# Patient Record
Sex: Female | Born: 1975
Health system: Southern US, Community
[De-identification: ages and names within clinical notes are randomized; demographics above are authoritative.]

## PROBLEM LIST (undated history)

## (undated) ENCOUNTER — Inpatient Hospital Stay (HOSPITAL_COMMUNITY): Payer: Self-pay

## (undated) DIAGNOSIS — O24419 Gestational diabetes mellitus in pregnancy, unspecified control: Secondary | ICD-10-CM

## (undated) DIAGNOSIS — Z9109 Other allergy status, other than to drugs and biological substances: Secondary | ICD-10-CM

## (undated) DIAGNOSIS — M1990 Unspecified osteoarthritis, unspecified site: Secondary | ICD-10-CM

## (undated) DIAGNOSIS — Z8669 Personal history of other diseases of the nervous system and sense organs: Secondary | ICD-10-CM

## (undated) DIAGNOSIS — T8859XA Other complications of anesthesia, initial encounter: Secondary | ICD-10-CM

## (undated) DIAGNOSIS — Z86018 Personal history of other benign neoplasm: Secondary | ICD-10-CM

## (undated) DIAGNOSIS — F410 Panic disorder [episodic paroxysmal anxiety] without agoraphobia: Secondary | ICD-10-CM

## (undated) DIAGNOSIS — D509 Iron deficiency anemia, unspecified: Secondary | ICD-10-CM

## (undated) DIAGNOSIS — Z87442 Personal history of urinary calculi: Secondary | ICD-10-CM

## (undated) DIAGNOSIS — N200 Calculus of kidney: Secondary | ICD-10-CM

## (undated) HISTORY — DX: Gestational diabetes mellitus in pregnancy, unspecified control: O24.419

## (undated) HISTORY — PX: TONSILLECTOMY: SUR1361

## (undated) HISTORY — PX: BRAIN TUMOR EXCISION: SHX577

---

## 1997-11-28 ENCOUNTER — Inpatient Hospital Stay (HOSPITAL_COMMUNITY): Admission: AD | Admit: 1997-11-28 | Discharge: 1997-11-28 | Payer: Self-pay | Admitting: Obstetrics and Gynecology

## 1997-12-18 ENCOUNTER — Ambulatory Visit (HOSPITAL_COMMUNITY): Admission: RE | Admit: 1997-12-18 | Discharge: 1997-12-18 | Payer: Self-pay | Admitting: Obstetrics and Gynecology

## 1997-12-23 ENCOUNTER — Ambulatory Visit (HOSPITAL_COMMUNITY): Admission: RE | Admit: 1997-12-23 | Discharge: 1997-12-23 | Payer: Self-pay | Admitting: Obstetrics and Gynecology

## 1998-01-10 ENCOUNTER — Inpatient Hospital Stay (HOSPITAL_COMMUNITY): Admission: AD | Admit: 1998-01-10 | Discharge: 1998-01-10 | Payer: Self-pay | Admitting: Obstetrics and Gynecology

## 1998-02-02 ENCOUNTER — Inpatient Hospital Stay (HOSPITAL_COMMUNITY): Admission: AD | Admit: 1998-02-02 | Discharge: 1998-02-02 | Payer: Self-pay | Admitting: Obstetrics and Gynecology

## 1998-02-03 ENCOUNTER — Inpatient Hospital Stay (HOSPITAL_COMMUNITY): Admission: AD | Admit: 1998-02-03 | Discharge: 1998-02-03 | Payer: Self-pay | Admitting: Obstetrics and Gynecology

## 1998-02-20 ENCOUNTER — Inpatient Hospital Stay (HOSPITAL_COMMUNITY): Admission: AD | Admit: 1998-02-20 | Discharge: 1998-02-20 | Payer: Self-pay | Admitting: Obstetrics and Gynecology

## 1998-03-04 ENCOUNTER — Inpatient Hospital Stay (HOSPITAL_COMMUNITY): Admission: AD | Admit: 1998-03-04 | Discharge: 1998-03-06 | Payer: Self-pay | Admitting: Obstetrics and Gynecology

## 1998-07-25 ENCOUNTER — Emergency Department (HOSPITAL_COMMUNITY): Admission: EM | Admit: 1998-07-25 | Discharge: 1998-07-26 | Payer: Self-pay | Admitting: Emergency Medicine

## 1999-03-31 ENCOUNTER — Other Ambulatory Visit: Admission: RE | Admit: 1999-03-31 | Discharge: 1999-03-31 | Payer: Self-pay | Admitting: Obstetrics and Gynecology

## 1999-07-24 ENCOUNTER — Encounter: Payer: Self-pay | Admitting: Obstetrics and Gynecology

## 1999-07-24 ENCOUNTER — Ambulatory Visit (HOSPITAL_COMMUNITY): Admission: RE | Admit: 1999-07-24 | Discharge: 1999-07-24 | Payer: Self-pay | Admitting: Obstetrics and Gynecology

## 2000-01-22 ENCOUNTER — Other Ambulatory Visit: Admission: RE | Admit: 2000-01-22 | Discharge: 2000-01-22 | Payer: Self-pay | Admitting: Obstetrics and Gynecology

## 2000-03-18 ENCOUNTER — Encounter: Payer: Self-pay | Admitting: Obstetrics and Gynecology

## 2000-03-18 ENCOUNTER — Ambulatory Visit (HOSPITAL_COMMUNITY): Admission: RE | Admit: 2000-03-18 | Discharge: 2000-03-18 | Payer: Self-pay | Admitting: Obstetrics and Gynecology

## 2000-06-13 ENCOUNTER — Ambulatory Visit (HOSPITAL_COMMUNITY): Admission: RE | Admit: 2000-06-13 | Discharge: 2000-06-13 | Payer: Self-pay | Admitting: Obstetrics and Gynecology

## 2000-06-16 ENCOUNTER — Inpatient Hospital Stay (HOSPITAL_COMMUNITY): Admission: AD | Admit: 2000-06-16 | Discharge: 2000-06-16 | Payer: Self-pay | Admitting: Obstetrics and Gynecology

## 2000-07-12 ENCOUNTER — Inpatient Hospital Stay (HOSPITAL_COMMUNITY): Admission: AD | Admit: 2000-07-12 | Discharge: 2000-07-12 | Payer: Self-pay | Admitting: Obstetrics and Gynecology

## 2000-08-03 ENCOUNTER — Inpatient Hospital Stay (HOSPITAL_COMMUNITY): Admission: AD | Admit: 2000-08-03 | Discharge: 2000-08-03 | Payer: Self-pay | Admitting: Obstetrics and Gynecology

## 2000-08-09 ENCOUNTER — Inpatient Hospital Stay (HOSPITAL_COMMUNITY): Admission: AD | Admit: 2000-08-09 | Discharge: 2000-08-11 | Payer: Self-pay | Admitting: Obstetrics and Gynecology

## 2000-12-30 ENCOUNTER — Other Ambulatory Visit: Admission: RE | Admit: 2000-12-30 | Discharge: 2000-12-30 | Payer: Self-pay | Admitting: Obstetrics and Gynecology

## 2001-01-20 ENCOUNTER — Encounter: Payer: Self-pay | Admitting: *Deleted

## 2001-01-20 ENCOUNTER — Encounter: Admission: RE | Admit: 2001-01-20 | Discharge: 2001-01-20 | Payer: Self-pay | Admitting: *Deleted

## 2002-02-13 ENCOUNTER — Emergency Department (HOSPITAL_COMMUNITY): Admission: EM | Admit: 2002-02-13 | Discharge: 2002-02-13 | Payer: Self-pay

## 2003-02-04 ENCOUNTER — Other Ambulatory Visit: Admission: RE | Admit: 2003-02-04 | Discharge: 2003-02-04 | Payer: Self-pay | Admitting: Obstetrics and Gynecology

## 2003-07-13 ENCOUNTER — Emergency Department (HOSPITAL_COMMUNITY): Admission: EM | Admit: 2003-07-13 | Discharge: 2003-07-13 | Payer: Self-pay | Admitting: Emergency Medicine

## 2003-08-02 ENCOUNTER — Emergency Department (HOSPITAL_COMMUNITY): Admission: AD | Admit: 2003-08-02 | Discharge: 2003-08-02 | Payer: Self-pay | Admitting: Family Medicine

## 2004-01-09 ENCOUNTER — Other Ambulatory Visit: Admission: RE | Admit: 2004-01-09 | Discharge: 2004-01-09 | Payer: Self-pay | Admitting: Obstetrics and Gynecology

## 2004-07-25 ENCOUNTER — Inpatient Hospital Stay (HOSPITAL_COMMUNITY): Admission: AD | Admit: 2004-07-25 | Discharge: 2004-07-28 | Payer: Self-pay | Admitting: Obstetrics and Gynecology

## 2005-02-25 ENCOUNTER — Other Ambulatory Visit: Admission: RE | Admit: 2005-02-25 | Discharge: 2005-02-25 | Payer: Self-pay | Admitting: Obstetrics and Gynecology

## 2006-07-04 ENCOUNTER — Emergency Department (HOSPITAL_COMMUNITY): Admission: EM | Admit: 2006-07-04 | Discharge: 2006-07-04 | Payer: Self-pay | Admitting: Family Medicine

## 2006-09-19 ENCOUNTER — Emergency Department (HOSPITAL_COMMUNITY): Admission: EM | Admit: 2006-09-19 | Discharge: 2006-09-19 | Payer: Self-pay | Admitting: Family Medicine

## 2008-01-17 ENCOUNTER — Ambulatory Visit (HOSPITAL_COMMUNITY): Admission: RE | Admit: 2008-01-17 | Discharge: 2008-01-17 | Payer: Self-pay | Admitting: Internal Medicine

## 2010-06-14 ENCOUNTER — Encounter: Payer: Self-pay | Admitting: Internal Medicine

## 2010-10-09 NOTE — Discharge Summary (Signed)
[facility]  Patient:    Stephanie [name], Stephanie [name]                       MRN: [id number] Adm. Date:  08/09/00 Disc. Date: 08/11/00 Attending:  Zenaida Niece, M.D.                           Discharge Summary  ADMISSION DIAGNOSIS:          Intrauterine pregnancy at 39 weeks.  DISCHARGE DIAGNOSIS:          Intrauterine pregnancy at 39 weeks.  PROCEDURE:                    Spontaneous vaginal delivery.  COMPLICATIONS:                None.  CONSULTING PHYSICIANS:        None.  HISTORY OF PRESENT ILLNESS:   This is a 35 year old white female, gravida 2, para 1-0-0-1, with an EGA of [redacted] weeks by a 9-week ultrasound with an EDC of March 25, who presents for induction due to a favorable cervix.  She has had good fetal movement and no vaginal bleeding or rupture of membranes.  She had occasional contractions.  Prenatal care complicated by possible exposure to Parvo virus B19 with evidence of prior exposure by blood work.  She was treated with a Z-Pak on February 9 for sinusitis and had a period of frequent loose stools with normal stool studies.  PRENATAL LABORATORY DATA:     Blood type is O positive with a negative antibody screen, RPR nonreactive, rubella immune, hepatitis B surface antigen negative, HIV negative, Gonorrhea and Chlamydia negative, triple screen normal. One-hour Glucola 148, three-hour GTT 91, 156, 140, and 58.  Group B Strep is negative.  PAST OBSTETRIC HISTORY:       In 1999 vaginal delivery at 39 weeks 8 pounds 5 ounces without complications.  PAST MEDICAL HISTORY:         History of anemia and a fractured clavicle at age 69.  PAST SURGICAL HISTORY:        Tonsillectomy.  The remainder of her history is noncontributory.  PHYSICAL EXAMINATION:         VITAL SIGNS:  She is afebrile with stable vital signs.  Fetal heart rate tracing reactive without decellerations.  She had irregular contractions.  ABDOMEN: Gravid and nontender with an  estimated fetal weight of 8 pounds.  PELVIC: Vaginal examination 3, 50, and -1 to -2 with a vertex presentation and an adequate pelvis.  Artificial rupture of membranes revealed clear amniotic fluid.  HOSPITAL COURSE:              The patient was admitted and had the above mentioned artificial rupture of membranes for induction and this did put her into labor.  She received only IV Stadol throughout the day, entered active labor, progressed to complete and pushed well.  On the evening of March 19, she delivered a viable female infant with Apgars of 8 and 8 that weighed 8 pounds 3 ounces over an intact perineum.  There was a loose nuchal cord x 1 which was reduced.  The placenta was delivered spontaneous and was intact. Perineum was intact and estimated blood loss was less than 500 cc.  Postpartum she did very well and remained afebrile with stable vital signs and breastfed her baby without complications.  She did  get some nausea, vomiting, and diarrhea, from the GI virus which her husband shared with her.  This was controlled with Lomotil and Phenergan.  On March 21, which was postpartum day #2 she was felt to be stable enough for discharge home.  CONDITION ON DISCHARGE:       Stable.  DISPOSITION:                  Discharged to home.  DISCHARGE INSTRUCTIONS:       Her diet is a regular diet.  Her activity is pelvic rest.  She is to follow up in four to six weeks.  She can take over-the-counter Immodium p.r.n. diarrhea. DD:  08/11/00 TD:  08/11/00 Job: [id number]

## 2010-10-09 NOTE — Discharge Summary (Signed)
Stephanie [name], Stephanie [name]              ACCOUNT NO.:  [id number]   MEDICAL RECORD NO.:  [id number]          PATIENT TYPE:  INP   LOCATION:  9115                          FACILITY:  [facility]   PHYSICIAN:  Malachi Pro. Ambrose Mantle, M.D. DATE OF BIRTH:  [birth year]   DATE OF ADMISSION:  07/25/2004  DATE OF DISCHARGE:                                 DISCHARGE SUMMARY   HOSPITAL COURSE:  A 35 year old white married female para 2-0-0-2 gravida 3;  EDC July 31, 2004 by ultrasound; admitted with right lower quadrant pain,  few contractions and a favorable cervix; also, decreased fetal movement.  Blood group and type O positive, negative antibody, nonreactive serology,  rubella equivocal, hepatitis B surface antigen negative, HIV negative, GC  and chlamydia negative, triple screen negative, 1-hour Glucola 82, group B  strep negative. Vaginal ultrasound December 26, 2003:  Crown-rump length 2.17  cm; 8 weeks 6 days; [facility] July 31, 2004. The patient was treated with  amoxicillin for sinus problem on February 17, 2004. Follow-up ultrasound  March 10, 2004:  Average gestational age [redacted] weeks 5 days; [facility] July 30, 2004. Cervix was 2-3 cm on July 20, 2004 and July 23, 2004. Nonstress  test reactive on July 23, 2004 for decreased fetal movement. On the night of  admission the patient called and stated she had right lower quadrant pain  and decreased fetal movement. MAU nurse felt the cervix was 3 cm, 50%. The  patient was having some contractions. I felt augmentation was appropriate  and the patient agreed. Past medical history:  Allergy to LATEX. Illnesses:  Anemia and thyroid disease, cystitis, and was born with a club foot.  Operation:  T&A at age 19. Injuries:  Fractured clavicle at age 31. Family  history:  Paternal grandfather with an MI and prostate cancer, father with  high blood pressure, brother with high blood pressure and a hole in the  heart, mother with a history of epilepsy. On admission, the patient's  vital  signs were normal. Physical exam did not reveal the cause of the right lower  quadrant pain. Artificial rupture of the membranes produced clear fluid. The  patient did not want Pitocin. Approximately midnight she accepted Pitocin.  The Pitocin was begun and she made very slow but steady progress to full  dilatation and delivered a living female infant, 9 pounds 5 ounces, over an  intact perineum with Apgars of 6 at one and 9 at five minutes. There was one  loop of nuchal cord and there was mild shoulder dystocia managed by  McRoberts maneuver. Placenta was intact, uterus normal. Venous cord pH 7.35.  Blood loss was 400 mL. Postpartum, the patient did well and was discharged  on postpartum day #2. Initial hemoglobin was 11.8; hematocrit 35.4; white  count 12,900; platelet count 254,000. Follow-up hematocrit 28.5.  Comprehensive metabolic profile was normal. Urinalysis was negative. Rubella  was repeated because our results were equivocal and the rubella was 22.1,  immune anything greater than 10.   DISCHARGE DIAGNOSIS:  Intrauterine pregnancy at 39+ weeks, delivered vertex.   OPERATION:  Spontaneous delivery vertex.  FINAL CONDITION:  Improved.   Instructions include our regular discharge instruction booklet. The patient  is asked to return in 6 weeks for follow-up exam. She does not need the  rubella vaccine and she declines analgesics.    TFH/MEDQ  D:  07/28/2004  T:  07/28/2004  Job:  [id number]

## 2011-03-09 ENCOUNTER — Emergency Department (HOSPITAL_COMMUNITY)
Admission: EM | Admit: 2011-03-09 | Discharge: 2011-03-10 | Disposition: A | Discharge status: Home / Self Care | Payer: Self-pay | Attending: Emergency Medicine | Admitting: Emergency Medicine

## 2011-03-09 DIAGNOSIS — R509 Fever, unspecified: Secondary | ICD-10-CM | POA: Insufficient documentation

## 2011-03-09 DIAGNOSIS — R059 Cough, unspecified: Secondary | ICD-10-CM | POA: Insufficient documentation

## 2011-03-09 DIAGNOSIS — R0602 Shortness of breath: Secondary | ICD-10-CM | POA: Insufficient documentation

## 2011-03-09 DIAGNOSIS — R5381 Other malaise: Secondary | ICD-10-CM | POA: Insufficient documentation

## 2011-03-09 DIAGNOSIS — R319 Hematuria, unspecified: Secondary | ICD-10-CM | POA: Insufficient documentation

## 2011-03-09 DIAGNOSIS — R05 Cough: Secondary | ICD-10-CM | POA: Insufficient documentation

## 2011-03-09 DIAGNOSIS — R5383 Other fatigue: Secondary | ICD-10-CM | POA: Insufficient documentation

## 2011-03-09 DIAGNOSIS — R110 Nausea: Secondary | ICD-10-CM | POA: Insufficient documentation

## 2011-03-09 DIAGNOSIS — N200 Calculus of kidney: Secondary | ICD-10-CM | POA: Insufficient documentation

## 2011-03-09 DIAGNOSIS — R197 Diarrhea, unspecified: Secondary | ICD-10-CM | POA: Insufficient documentation

## 2011-03-09 DIAGNOSIS — R109 Unspecified abdominal pain: Secondary | ICD-10-CM | POA: Insufficient documentation

## 2011-03-09 DIAGNOSIS — IMO0001 Reserved for inherently not codable concepts without codable children: Secondary | ICD-10-CM | POA: Insufficient documentation

## 2011-03-09 LAB — POCT PREGNANCY, URINE: Preg Test, Ur: NEGATIVE

## 2011-03-09 LAB — URINALYSIS, ROUTINE W REFLEX MICROSCOPIC
Bilirubin Urine: NEGATIVE
Glucose, UA: NEGATIVE mg/dL
Ketones, ur: 15 mg/dL — AB
Leukocytes, UA: NEGATIVE
Nitrite: NEGATIVE
Protein, ur: NEGATIVE mg/dL
Specific Gravity, Urine: 1.013 (ref 1.005–1.030)
Urobilinogen, UA: 0.2 mg/dL (ref 0.0–1.0)
pH: 6.5 (ref 5.0–8.0)

## 2011-03-10 ENCOUNTER — Encounter (HOSPITAL_COMMUNITY): Payer: Self-pay | Admitting: Radiology

## 2011-03-10 ENCOUNTER — Emergency Department (HOSPITAL_COMMUNITY): Payer: Self-pay

## 2011-03-10 LAB — CBC
Hemoglobin: 11.4 g/dL — ABNORMAL LOW (ref 12.0–15.0)
MCH: 29.8 pg (ref 26.0–34.0)
MCV: 86.4 fL (ref 78.0–100.0)
RBC: 3.83 MIL/uL — ABNORMAL LOW (ref 3.87–5.11)

## 2011-03-10 LAB — COMPREHENSIVE METABOLIC PANEL
ALT: 11 U/L (ref 0–35)
CO2: 21 mEq/L (ref 19–32)
Calcium: 10.1 mg/dL (ref 8.4–10.5)
Creatinine, Ser: 0.7 mg/dL (ref 0.50–1.10)
GFR calc Af Amer: >90 mL/min (ref >90)
GFR calc non Af Amer: >90 mL/min (ref >90)
Glucose, Bld: 114 mg/dL — ABNORMAL HIGH (ref 70–99)
Sodium: 135 mEq/L (ref 135–145)

## 2011-03-10 LAB — DIFFERENTIAL
Lymphocytes Relative: 10 % — ABNORMAL LOW (ref 12–46)
Lymphs Abs: 1.4 10*3/uL (ref 0.7–4.0)
Monocytes Relative: 6 % (ref 3–12)
Neutro Abs: 12.5 10*3/uL — ABNORMAL HIGH (ref 1.7–7.7)
Neutrophils Relative %: 85 % — ABNORMAL HIGH (ref 43–77)

## 2011-03-11 ENCOUNTER — Inpatient Hospital Stay (INDEPENDENT_AMBULATORY_CARE_PROVIDER_SITE_OTHER)
Admission: RE | Admit: 2011-03-11 | Discharge: 2011-03-11 | Disposition: A | Discharge status: Home / Self Care | Payer: Self-pay | Source: Ambulatory Visit | Attending: Family Medicine | Admitting: Family Medicine

## 2011-03-11 DIAGNOSIS — R319 Hematuria, unspecified: Secondary | ICD-10-CM

## 2011-03-11 LAB — POCT URINALYSIS DIP (DEVICE)
Specific Gravity, Urine: 1.025 (ref 1.005–1.030)
pH: 6 (ref 5.0–8.0)

## 2011-03-11 LAB — URINE CULTURE: Culture  Setup Time: 201210170013

## 2011-03-13 ENCOUNTER — Inpatient Hospital Stay (INDEPENDENT_AMBULATORY_CARE_PROVIDER_SITE_OTHER)
Admission: RE | Admit: 2011-03-13 | Discharge: 2011-03-13 | Disposition: A | Discharge status: Home / Self Care | Payer: Self-pay | Source: Ambulatory Visit | Attending: Emergency Medicine | Admitting: Emergency Medicine

## 2011-03-13 DIAGNOSIS — J020 Streptococcal pharyngitis: Secondary | ICD-10-CM

## 2011-03-13 DIAGNOSIS — N200 Calculus of kidney: Secondary | ICD-10-CM

## 2011-03-13 LAB — POCT URINALYSIS DIP (DEVICE)
Bilirubin Urine: NEGATIVE
Ketones, ur: NEGATIVE mg/dL
Specific Gravity, Urine: 1.015 (ref 1.005–1.030)
pH: 8 (ref 5.0–8.0)

## 2011-03-15 LAB — URINE CULTURE
Colony Count: NO GROWTH
Culture  Setup Time: 201210210338

## 2011-03-16 LAB — CULTURE, BLOOD (ROUTINE X 2)
Culture  Setup Time: 201210170334
Culture: NO GROWTH

## 2012-11-10 ENCOUNTER — Encounter (HOSPITAL_COMMUNITY): Payer: Self-pay | Admitting: *Deleted

## 2012-11-10 ENCOUNTER — Emergency Department (HOSPITAL_COMMUNITY): Payer: Self-pay

## 2012-11-10 ENCOUNTER — Emergency Department (HOSPITAL_COMMUNITY)
Admission: EM | Admit: 2012-11-10 | Discharge: 2012-11-11 | Disposition: A | Discharge status: Home / Self Care | Payer: Self-pay | Attending: Emergency Medicine | Admitting: Emergency Medicine

## 2012-11-10 DIAGNOSIS — H0589 Other disorders of orbit: Secondary | ICD-10-CM

## 2012-11-10 DIAGNOSIS — H0520 Unspecified exophthalmos: Secondary | ICD-10-CM | POA: Insufficient documentation

## 2012-11-10 DIAGNOSIS — Z79899 Other long term (current) drug therapy: Secondary | ICD-10-CM | POA: Insufficient documentation

## 2012-11-10 DIAGNOSIS — Z91040 Latex allergy status: Secondary | ICD-10-CM | POA: Insufficient documentation

## 2012-11-10 DIAGNOSIS — J309 Allergic rhinitis, unspecified: Secondary | ICD-10-CM | POA: Insufficient documentation

## 2012-11-10 DIAGNOSIS — H5789 Other specified disorders of eye and adnexa: Secondary | ICD-10-CM | POA: Insufficient documentation

## 2012-11-10 DIAGNOSIS — R51 Headache: Secondary | ICD-10-CM | POA: Insufficient documentation

## 2012-11-10 DIAGNOSIS — Z87442 Personal history of urinary calculi: Secondary | ICD-10-CM | POA: Insufficient documentation

## 2012-11-10 HISTORY — DX: Other allergy status, other than to drugs and biological substances: Z91.09

## 2012-11-10 HISTORY — DX: Calculus of kidney: N20.0

## 2012-11-10 MED ORDER — IOHEXOL 300 MG/ML  SOLN
100.0000 mL | Freq: Once | INTRAMUSCULAR | Status: AC | PRN
  Administered 2012-11-10: 100 mL via INTRAVENOUS

## 2012-11-10 NOTE — ED Provider Notes (Addendum)
History    37 year old female presenting for evaluation of proptosis the right eye. First noticed this about 3 weeks ago. Has progressively been getting worse since then. Denies pain but has a sensation of pressure behind her R eye, in her nose and R upper teeth. Denies any change in visual acuity. No diplopia. No visual cuts. Fever or chills. Denies trauma. No eye drainage. Hx of seasonal allergies and kidney stones, otherwise healthy. No hx of similar symptoms. Evaluated by optometrist today and told that had "increased pressure on her optic nerve."  CSN: [id number]  Arrival date & time 11/10/12  2025   First MD Initiated Contact with Patient 11/10/12 2134      Chief Complaint  Patient presents with  . Eye Problem    (Consider location/radiation/quality/duration/timing/severity/associated sxs/prior treatment) HPI  Past Medical History  Diagnosis Date  . Environmental allergies   . Kidney stones     Past Surgical History  Procedure Laterality Date  . Tonsillectomy      No family history on file.  History  Substance Use Topics  . Smoking status: Never Smoker   . Smokeless tobacco: Not on file  . Alcohol Use: Yes     Comment: social    OB History   Grav Para Term Preterm Abortions TAB SAB Ect Mult Living                  Review of Systems  All systems reviewed and negative, other than as noted in HPI.   Allergies  Flonase and Latex  Home Medications   Current Outpatient Rx  Name  Route  Sig  Dispense  Refill  . cetirizine (ZYRTEC ALLERGY) 10 MG tablet   Oral   Take 10 mg by mouth daily.         Marland Kitchen loteprednol (LOTEMAX) 0.5 % ophthalmic suspension   Right Eye   Place 1 drop into the right eye 2 (two) times daily.           BP 138/82  Pulse 99  Temp(Src) 99.3 F (37.4 C) (Oral)  Resp 20  Wt 170 lb (77.111 kg)  SpO2 99%  LMP 11/08/2012  Physical Exam  Nursing note and vitals reviewed. Constitutional: She appears well-developed and  well-nourished. No distress.  HENT:  Head: Normocephalic and atraumatic.  Eyes: Conjunctivae and EOM are normal. Pupils are equal, round, and reactive to light. Right eye exhibits no discharge. Left eye exhibits no discharge.  R eye proptosis. Periorbital tissues unremarkable. EOMI intact. Denies pain with EOM movement. Tactile tonometry symmetric b/l. Anterior chamber clear. Conjunctiva normal in appearance.  Neck: Neck supple.  Cardiovascular: Normal rate, regular rhythm and normal heart sounds.  Exam reveals no gallop and no friction rub.   No murmur heard. Pulmonary/Chest: Effort normal and breath sounds normal. No respiratory distress.  Abdominal: Soft. She exhibits no distension. There is no tenderness.  Musculoskeletal: She exhibits no edema and no tenderness.  Neurological: She is alert. No cranial nerve deficit.  Skin: Skin is warm and dry.  Psychiatric: She has a normal mood and affect. Her behavior is normal. Thought content normal.    ED Course  Procedures (including critical care time)  Labs Reviewed - No data to display Ct Head W Wo Contrast  11/10/2012   *RADIOLOGY REPORT*  Clinical Data: Right eye swelling and loss of vision.  CT HEAD WITHOUT AND WITH CONTRAST  Technique:  Contiguous axial images were obtained from the base of the skull through the vertex  without and with intravenous contrast.  Contrast: OMNIPAQUE IOHEXOL 300 MG/ML  SOLN  Comparison: None  Findings: The right globe is proptotic and there is fairly marked hyperostosis involving the posterior lateral wall of the right orbit and also the sphenoid bone.  There is enhancing soft tissue around the lesion with mass effect on the lateral rectus muscle and slight bowing of the optic nerve.  There is also enhancing soft tissue in the anterior cranial fossa and some enhancing soft tissue along the right  lateral external orbital region.  This could reflect a meningioma.  Lymphoma is another possibility.  The  ventricles are normal.  No extra-axial fluid collections.  No CT findings for infarction or hemorrhage.  The brainstem and cerebellum grossly normal.  IMPRESSION: Probable primary osseous lesion with soft tissue components enhancing in the intraorbital region, anterior cranial fossa and along the external aspect of the right orbit.  Meningioma and lymphoma are possibilities.  There is mild mass effect on the lateral rectus muscle and optic nerve.   Original Report Authenticated By: Rudie Meyer, M.D.      1. Orbital mass   2. Ocular proptosis       MDM  37yF with R eye proptosis. CT as above. Discussed with Dr Yetta Barre, neurosurgery. Reviewed films. Feels complexity requiring tertiary care center.  Will discuss with neurosurgery/ophtho at [facility] to help facilitate prompt follow-up.  Discussed with Dr Sheppard Penton at [facility]. Pt can be seen on Monday in [place] or Thursday at [place]. Does not recommend continued steroids. Discussed plan with pt and fiance. Contact information with scheduling coordinator, April, provided. Provided with a copy of her imaging. Emergent return precautions discussed.        Raeford Razor, MD 11/11/12 [phone]

## 2012-11-10 NOTE — ED Notes (Signed)
Patient transported to CT 

## 2012-11-10 NOTE — ED Notes (Signed)
Right eye swelling and loss of side vision,  She has had an ocular scan and thyroid blood work today and she has been using steroid drops and the swelling has decreased minimal,  Pt states she can feel pressure behind her right eye, pt didn't have a CT scan earlier today but doctor did suggest it and now she wishes she had scan. So pt has field matrix test and ocular scan .  Dr Ashok Cordia is available if we need to converse with her,  Pt has her cell number if needed

## 2012-11-11 MED ORDER — DEXAMETHASONE SODIUM PHOSPHATE 10 MG/ML IJ SOLN
10.0000 mg | Freq: Once | INTRAMUSCULAR | Status: AC
  Administered 2012-11-11: 10 mg via INTRAVENOUS
  Filled 2012-11-11: qty 1

## 2012-11-11 NOTE — ED Notes (Signed)
Pt w/ rt eye protrusion laterally and swelling x1 week, pt denies pain, only admits to pressure. Pt has been seen for this however did not have CT scan at that time as ophthalmologist suggest. Pt here tonight requesting CT scan.

## 2012-11-11 NOTE — ED Notes (Signed)
Pt ambulating independently w/ steady gait on d/c in no acute distress, A&Ox4.D/c instructions reviewed w/ pt and family - pt and family deny any further questions or concerns at present.  

## 2012-11-13 ENCOUNTER — Ambulatory Visit (HOSPITAL_COMMUNITY)
Admission: RE | Admit: 2012-11-13 | Discharge: 2012-11-13 | Disposition: A | Discharge status: Home / Self Care | Payer: Medicaid Other | Source: Ambulatory Visit | Attending: Emergency Medicine | Admitting: Emergency Medicine

## 2012-11-13 DIAGNOSIS — M852 Hyperostosis of skull: Secondary | ICD-10-CM | POA: Insufficient documentation

## 2012-11-13 DIAGNOSIS — H5789 Other specified disorders of eye and adnexa: Secondary | ICD-10-CM | POA: Insufficient documentation

## 2012-11-13 DIAGNOSIS — H0520 Unspecified exophthalmos: Secondary | ICD-10-CM

## 2012-11-13 MED ORDER — GADOBENATE DIMEGLUMINE 529 MG/ML IV SOLN
20.0000 mL | Freq: Once | INTRAVENOUS | Status: AC | PRN
  Administered 2012-11-13: 17 mL via INTRAVENOUS

## 2012-11-21 HISTORY — PX: SPHENOIDECTOMY: SHX2421

## 2013-01-09 ENCOUNTER — Emergency Department (HOSPITAL_COMMUNITY)
Admission: EM | Admit: 2013-01-09 | Discharge: 2013-01-09 | Disposition: A | Discharge status: Home / Self Care | Payer: 59 | Source: Home / Self Care

## 2013-01-09 ENCOUNTER — Encounter (HOSPITAL_COMMUNITY): Payer: Self-pay | Admitting: Emergency Medicine

## 2013-01-09 DIAGNOSIS — R5381 Other malaise: Secondary | ICD-10-CM

## 2013-01-09 DIAGNOSIS — N390 Urinary tract infection, site not specified: Secondary | ICD-10-CM

## 2013-01-09 LAB — POCT URINALYSIS DIP (DEVICE)
Bilirubin Urine: NEGATIVE
Nitrite: NEGATIVE
Protein, ur: NEGATIVE mg/dL
pH: 7.5 (ref 5.0–8.0)

## 2013-01-09 MED ORDER — ONDANSETRON HCL 4 MG PO TABS: 4.0000 mg | ORAL_TABLET | Freq: Four times a day (QID) | ORAL | Status: DC

## 2013-01-09 MED ORDER — CEPHALEXIN 500 MG PO CAPS: 500.0000 mg | ORAL_CAPSULE | Freq: Four times a day (QID) | ORAL | Status: DC

## 2013-01-09 NOTE — ED Notes (Signed)
Pt c/o poss UTI onset Sunday Sxs include: urinary frequency, fevers, BA, HA, weakness Denies: dysuria, hematuria, abd pain Recently had craniectomy to remove sphenoid wing meningioma w/bone over growth in July at [facility] Has appt w/nuerosurgeon on Monday... Alert w/no signs of acute distress

## 2013-01-09 NOTE — ED Provider Notes (Signed)
CSN: [id number]     Arrival date & time 01/09/13  1421 History     First MD Initiated Contact with Patient 01/09/13 1452     Chief Complaint  Patient presents with  . Urinary Tract Infection   (Consider location/radiation/quality/duration/timing/severity/associated sxs/prior Treatment) HPI Comments: 37 year old female is nearly 4 weeks status post craniectomy for meningioma resection. Although she has complained of persistent weakness since the surgery  sometimes associated with chills, decreased fluid intake and nausea she has recently developed a urinary urgency with the sensation of bladder fullness, small voids followed by the sensation to void again. Denies pain associated with wheezing. She is complaining of malaise, fatigue, off and on chills for the past 3-4 days. Some nausea no vomiting. No current fever.   Past Medical History  Diagnosis Date  . Environmental allergies   . Kidney stones    Past Surgical History  Procedure Laterality Date  . Tonsillectomy    . Sphenoidectomy  11/2012   No family history on file. History  Substance Use Topics  . Smoking status: Never Smoker   . Smokeless tobacco: Not on file  . Alcohol Use: Yes     Comment: social   OB History   Grav Para Term Preterm Abortions TAB SAB Ect Mult Living                 Review of Systems  Constitutional: Positive for fever, activity change, appetite change and fatigue.  Respiratory: Negative.   Cardiovascular: Negative.   Gastrointestinal: Positive for nausea. Negative for vomiting and abdominal pain.  Genitourinary: Positive for urgency, frequency and decreased urine volume. Negative for dysuria, flank pain, vaginal bleeding, vaginal discharge, vaginal pain, menstrual problem and pelvic pain.  Skin: Negative for rash.  Neurological: Positive for headaches. Negative for seizures, speech difficulty and numbness.    Allergies  Flonase and Latex  Home Medications   Current Outpatient Rx  Name   Route  Sig  Dispense  Refill  . cetirizine (ZYRTEC ALLERGY) 10 MG tablet   Oral   Take 10 mg by mouth daily.         Marland Kitchen desogestrel-ethinyl estradiol (APRI,EMOQUETTE,SOLIA) 0.15-30 MG-MCG tablet   Oral   Take 1 tablet by mouth daily.         . Oxycodone-Acetaminophen (PERCOCET PO)   Oral   Take by mouth.         . cephALEXin (KEFLEX) 500 MG capsule   Oral   Take 1 capsule (500 mg total) by mouth 4 (four) times daily.   40 capsule   0   . loteprednol (LOTEMAX) 0.5 % ophthalmic suspension   Right Eye   Place 1 drop into the right eye 2 (two) times daily.          BP 121/77  Pulse 96  Temp(Src) 98.7 F (37.1 C) (Oral)  Resp 16  SpO2 98%  LMP 12/27/2012 Physical Exam  Nursing note and vitals reviewed. Constitutional: She is oriented to person, place, and time. She appears well-developed and well-nourished. No distress.  Appears mildly ill but not toxic.  Eyes: EOM are normal.  Neck: Normal range of motion.  Cardiovascular: Normal rate, regular rhythm and normal heart sounds.   Pulmonary/Chest: Effort normal and breath sounds normal. No respiratory distress.  Abdominal: Soft. Bowel sounds are normal. She exhibits no distension and no mass. There is no tenderness. There is no rebound and no guarding.  Musculoskeletal: She exhibits no edema.  Neurological: She is alert and oriented  to person, place, and time. She exhibits normal muscle tone.  Skin: Skin is warm and dry.    ED Course   Procedures (including critical care time)  Labs Reviewed  POCT URINALYSIS DIP (DEVICE) - Abnormal; Notable for the following:    Hgb urine dipstick SMALL (*)    Leukocytes, UA TRACE (*)    All other components within normal limits  URINE CULTURE  POCT PREGNANCY, URINE   No results found. 1. UTI (lower urinary tract infection)   2. Malaise and fatigue     MDM  Is somewhat difficult to discern if the symptoms described today are all due to the UTI diagnosis. Some symptoms had  persisted since her cranial surgery on July 21. She is advised to take Keflex as directed and if not better in 48 hours or if worse or develops new symptoms she should go to emergency department. She has no local followup. There are no new head related symptoms although she continues to have periodic headaches. She is afebrile today and in stable condition. Keflex 500 4 times a day, increase her fluid intake.   Hayden Rasmussen, NP 01/09/13 1601

## 2013-01-09 NOTE — ED Provider Notes (Signed)
Medical screening examination/treatment/procedure(s) were performed by non-physician practitioner and as supervising physician I was immediately available for consultation/collaboration.  Leslee Home, M.D.  Reuben Likes, MD 01/09/13 [phone]

## 2013-01-10 LAB — URINE CULTURE: Colony Count: NO GROWTH

## 2013-06-23 ENCOUNTER — Encounter (HOSPITAL_COMMUNITY): Payer: Self-pay | Admitting: Emergency Medicine

## 2013-06-23 ENCOUNTER — Emergency Department (INDEPENDENT_AMBULATORY_CARE_PROVIDER_SITE_OTHER)
Admission: EM | Admit: 2013-06-23 | Discharge: 2013-06-23 | Disposition: A | Discharge status: Home / Self Care | Payer: 59 | Source: Home / Self Care | Attending: Emergency Medicine | Admitting: Emergency Medicine

## 2013-06-23 DIAGNOSIS — N10 Acute tubulo-interstitial nephritis: Secondary | ICD-10-CM

## 2013-06-23 LAB — POCT URINALYSIS DIP (DEVICE)
Bilirubin Urine: NEGATIVE
Glucose, UA: NEGATIVE mg/dL
Ketones, ur: NEGATIVE mg/dL
NITRITE: NEGATIVE
PH: 8.5 — AB (ref 5.0–8.0)
PROTEIN: NEGATIVE mg/dL
Specific Gravity, Urine: 1.02 (ref 1.005–1.030)
UROBILINOGEN UA: 0.2 mg/dL (ref 0.0–1.0)

## 2013-06-23 LAB — POCT PREGNANCY, URINE: Preg Test, Ur: NEGATIVE

## 2013-06-23 MED ORDER — HYDROCODONE-ACETAMINOPHEN 5-325 MG PO TABS
ORAL_TABLET | ORAL | Status: DC
Start: 2013-06-23 — End: 2013-12-17

## 2013-06-23 MED ORDER — LIDOCAINE HCL (PF) 1 % IJ SOLN
INTRAMUSCULAR | Status: AC
  Filled 2013-06-23: qty 5

## 2013-06-23 MED ORDER — CIPROFLOXACIN HCL 500 MG PO TABS: 500.0000 mg | ORAL_TABLET | Freq: Two times a day (BID) | ORAL | Status: DC

## 2013-06-23 MED ORDER — CEFTRIAXONE SODIUM 1 G IJ SOLR
1.0000 g | Freq: Once | INTRAMUSCULAR | Status: AC
  Administered 2013-06-23: 1 g via INTRAMUSCULAR

## 2013-06-23 MED ORDER — CEFTRIAXONE SODIUM 1 G IJ SOLR
INTRAMUSCULAR | Status: AC
  Filled 2013-06-23: qty 10

## 2013-06-23 NOTE — Discharge Instructions (Signed)

## 2013-06-23 NOTE — ED Provider Notes (Signed)
Chief Complaint   Chief Complaint  Patient presents with  . Urinary Tract Infection    History of Present Illness   Stephanie [name] is a 38 year old female who's had a one-week history of dysuria, frequency, and urgency. She denies any hematuria or malodorous urine. She's had a temperature 101.2 degrees, chills, abdominal pain, and back pain. She had a urinary tract infection last August or September. She denies any nausea, vomiting, or GYN complaints.  Review of Systems   Other than as noted above, the patient denies any of the following symptoms: General:  No fevers, chills, or sweats. GI:  No abdominal pain, back pain, nausea, vomiting, diarrhea, or constipation. GU:  No dysuria, frequency, urgency, hematuria, or incontinence. GYN:  No discharge, itching, vulvar pain or lesions, pelvic pain, or abnormal vaginal bleeding.  PMFSH   Past medical history, family history, social history, meds, and allergies were reviewed.  She has a history of benign brain tumor.  Physical Examination     Vital signs:  BP 132/79  Pulse 88  Temp(Src) 98 F (36.7 C) (Oral)  SpO2 100%  LMP 06/08/2013 Gen:  Alert, oriented, in no distress. Lungs:  Clear to auscultation, no wheezes, rales or rhonchi. Heart:  Regular rhythm, no gallop or murmer. Abdomen:  Flat and soft. There was slight suprapubic pain to palpation.  No guarding, or rebound.  No hepato-splenomegaly or mass.  Bowel sounds were normally active.  No hernia. Back:  No CVA tenderness.  Skin:  Clear, warm and dry.  Labs   Results for orders placed during the hospital encounter of 06/23/13  POCT URINALYSIS DIP (DEVICE)      Result Value Range   Glucose, UA NEGATIVE  NEGATIVE mg/dL   Bilirubin Urine NEGATIVE  NEGATIVE   Ketones, ur NEGATIVE  NEGATIVE mg/dL   Specific Gravity, Urine 1.020  1.005 - 1.030   Hgb urine dipstick TRACE (*) NEGATIVE   pH 8.5 (*) 5.0 - 8.0   Protein, ur NEGATIVE  NEGATIVE mg/dL   Urobilinogen, UA 0.2  0.0  - 1.0 mg/dL   Nitrite NEGATIVE  NEGATIVE   Leukocytes, UA TRACE (*) NEGATIVE  POCT PREGNANCY, URINE      Result Value Range   Preg Test, Ur NEGATIVE  NEGATIVE     A urine culture was obtained.  Results are pending at this time and we will call about any positive results.  Course in Urgent Care Center   Given Rocephin 1 g IM.  Assessment   The encounter diagnosis was Acute pyelonephritis.   Appears to have mild pyelonephritis. No vomiting right now, so I think she be a good candidate for outpatient treatment.  Plan   1.  Meds:  The following meds were prescribed:   Discharge Medication List as of 06/23/2013  9:32 AM    START taking these medications   Details  ciprofloxacin (CIPRO) 500 MG tablet Take 1 tablet (500 mg total) by mouth every 12 (twelve) hours., Starting 06/23/2013, Until Discontinued, Normal    HYDROcodone-acetaminophen (NORCO/VICODIN) 5-325 MG per tablet 1 to 2 tabs every 4 to 6 hours as needed for pain., Print        2.  Patient Education/Counseling:  The patient was given appropriate handouts, self care instructions, and instructed in symptomatic relief. The patient was told to avoid intercourse for 10 days, get extra fluids, and return for a follow up with her primary care doctor at the completion of treatment for a repeat UA and culture.  Return here in 48 hours if no better, or immediately if she develops vomiting or severe pain.  3.  Follow up:  The patient was told to follow up here if no better in 3 to 4 days, or sooner if becoming worse in any way, and given some red flag symptoms such as fever, persistent vomiting, or severe flank or abdominal pain which would prompt immediate return.     Reuben Likesavid C Ayano Douthitt, MD 06/23/13 [id number]

## 2013-06-23 NOTE — ED Notes (Signed)
C/o UTI for a week now States she has back pain, burning when urinating, urinating frequently and fever/chills States ibuprofen was used as treatment States she did try to flush kidneys out with cranberry and water

## 2013-06-25 LAB — URINE CULTURE: SPECIAL REQUESTS: NORMAL

## 2013-06-25 NOTE — Progress Notes (Signed)
Quick Note:  Results are abnormal as noted, but have been adequately treated. The patient called back today and stated that she did not feel much better. She is on ciprofloxacin, her enterococcus is sensitive to levofloxacin, but ciprofloxacin was not tested. Since she's not doing any better we told her to stop the ciprofloxacin and called in a prescription for amoxicillin 500 mg #30 one 3 times a day. Suggested she return here if no better in 48 hours. No further action necessary. ______

## 2013-06-25 NOTE — ED Notes (Signed)
Patient called, c/o she is no better from her visit 1-31. After review of chart and C&S report, Dr Lorenz CoasterKeller authorized amoxicillin 500 mg, x 30 , take TID, NR use generic and pyridium 200 mg po TID prn x 15 doses. Called in to [employer], [place] at pt request, left detailed message on answering machine

## 2013-06-26 NOTE — ED Notes (Signed)
Urine culture: 85,000 colonies Enterococcus species. Pt. treated with Cipro (not on sensitivity).  Pt. called yesterday and was no better.  Dr. Lorenz CoasterKeller changed her to Amoxicillin.  Rx. called by Michiel CowboyNancy Stack RN. Vassie MoselleYork, Brettany Sydney M 06/26/2013

## 2013-12-17 ENCOUNTER — Emergency Department (HOSPITAL_COMMUNITY): Payer: 59

## 2013-12-17 ENCOUNTER — Emergency Department (HOSPITAL_COMMUNITY)
Admission: EM | Admit: 2013-12-17 | Discharge: 2013-12-17 | Disposition: A | Discharge status: Home / Self Care | Payer: 59 | Attending: Emergency Medicine | Admitting: Emergency Medicine

## 2013-12-17 ENCOUNTER — Encounter (HOSPITAL_COMMUNITY): Payer: Self-pay | Admitting: Emergency Medicine

## 2013-12-17 DIAGNOSIS — Z91040 Latex allergy status: Secondary | ICD-10-CM | POA: Diagnosis not present

## 2013-12-17 DIAGNOSIS — Z87442 Personal history of urinary calculi: Secondary | ICD-10-CM | POA: Diagnosis not present

## 2013-12-17 DIAGNOSIS — M542 Cervicalgia: Secondary | ICD-10-CM | POA: Insufficient documentation

## 2013-12-17 DIAGNOSIS — H538 Other visual disturbances: Secondary | ICD-10-CM | POA: Diagnosis not present

## 2013-12-17 DIAGNOSIS — H53149 Visual discomfort, unspecified: Secondary | ICD-10-CM | POA: Insufficient documentation

## 2013-12-17 DIAGNOSIS — Z3202 Encounter for pregnancy test, result negative: Secondary | ICD-10-CM | POA: Insufficient documentation

## 2013-12-17 DIAGNOSIS — R51 Headache: Secondary | ICD-10-CM | POA: Insufficient documentation

## 2013-12-17 DIAGNOSIS — R5383 Other fatigue: Secondary | ICD-10-CM

## 2013-12-17 DIAGNOSIS — R5381 Other malaise: Secondary | ICD-10-CM | POA: Insufficient documentation

## 2013-12-17 DIAGNOSIS — Z791 Long term (current) use of non-steroidal anti-inflammatories (NSAID): Secondary | ICD-10-CM | POA: Insufficient documentation

## 2013-12-17 DIAGNOSIS — Z8709 Personal history of other diseases of the respiratory system: Secondary | ICD-10-CM | POA: Insufficient documentation

## 2013-12-17 DIAGNOSIS — R519 Headache, unspecified: Secondary | ICD-10-CM

## 2013-12-17 LAB — POC URINE PREG, ED: PREG TEST UR: NEGATIVE

## 2013-12-17 MED ORDER — KETOROLAC TROMETHAMINE 30 MG/ML IJ SOLN
30.0000 mg | Freq: Once | INTRAMUSCULAR | Status: AC
  Administered 2013-12-17: 30 mg via INTRAVENOUS
  Filled 2013-12-17: qty 1

## 2013-12-17 MED ORDER — SODIUM CHLORIDE 0.9 % IV BOLUS (SEPSIS)
1000.0000 mL | Freq: Once | INTRAVENOUS | Status: AC
Start: 2013-12-17 — End: 2013-12-17
  Administered 2013-12-17: 1000 mL via INTRAVENOUS

## 2013-12-17 MED ORDER — METOCLOPRAMIDE HCL 5 MG/ML IJ SOLN
10.0000 mg | Freq: Once | INTRAMUSCULAR | Status: AC
  Administered 2013-12-17: 10 mg via INTRAVENOUS
  Filled 2013-12-17: qty 2

## 2013-12-17 MED ORDER — DIPHENHYDRAMINE HCL 50 MG/ML IJ SOLN
25.0000 mg | Freq: Once | INTRAMUSCULAR | Status: AC
  Administered 2013-12-17: 25 mg via INTRAVENOUS
  Filled 2013-12-17: qty 1

## 2013-12-17 MED ORDER — DEXAMETHASONE SODIUM PHOSPHATE 10 MG/ML IJ SOLN
10.0000 mg | Freq: Once | INTRAMUSCULAR | Status: AC
  Administered 2013-12-17: 10 mg via INTRAVENOUS
  Filled 2013-12-17: qty 1

## 2013-12-17 NOTE — Discharge Instructions (Signed)

## 2013-12-17 NOTE — ED Notes (Signed)
Patient transported to CT 

## 2013-12-17 NOTE — ED Provider Notes (Signed)
CSN: [id number]     Arrival date & time 12/17/13  1050 History   First MD Initiated Contact with Patient 12/17/13 1147     Chief Complaint  Patient presents with  . Headache  . Neck Pain  . Nausea  . Dizziness     (Consider location/radiation/quality/duration/timing/severity/associated sxs/prior Treatment) Patient is a 38 y.o. female presenting with headaches.  Headache Pain location:  Frontal Quality: pressure. Radiates to:  Does not radiate Pain severity now: severe. Onset quality:  Gradual Duration:  7 days Timing:  Constant Progression:  Waxing and waning Chronicity:  Recurrent Similar to prior headaches: yes (but longer)   Context comment:  Hx of sphenoid meningioma with intermittent headaches, blurry vision since resection and facial reconstruction. Relieved by:  Nothing Worsened by:  Light Ineffective treatments: tramadol. Associated symptoms: blurred vision (Has blurry vision at baseline, but worse with headache), fatigue, neck pain and photophobia   Associated symptoms: no fever   Associated symptoms comment:  Focal left sided neck pain which started suddenly when she turned her head two days ago.   Past Medical History  Diagnosis Date  . Environmental allergies   . Kidney stones    Past Surgical History  Procedure Laterality Date  . Tonsillectomy    . Sphenoidectomy  11/2012  . Brain tumor excision     No family history on file. History  Substance Use Topics  . Smoking status: Never Smoker   . Smokeless tobacco: Not on file  . Alcohol Use: Yes     Comment: social   OB History   Grav Para Term Preterm Abortions TAB SAB Ect Mult Living                 Review of Systems  Constitutional: Positive for fatigue. Negative for fever.  Eyes: Positive for blurred vision (Has blurry vision at baseline, but worse with headache) and photophobia.  Musculoskeletal: Positive for neck pain.  Neurological: Positive for headaches.  All other systems reviewed and  are negative.     Allergies  Flonase and Latex  Home Medications   Prior to Admission medications   Medication Sig Start Date End Date Taking? Authorizing Provider  ibuprofen (ADVIL,MOTRIN) 600 MG tablet Take 600 mg by mouth every 6 (six) hours as needed for headache or moderate pain.   Yes Historical Provider, MD  levonorgestrel (MIRENA) 20 MCG/24HR IUD 1 each by Intrauterine route once.   Yes Historical Provider, MD  meloxicam (MOBIC) 15 MG tablet Take 15 mg by mouth daily.   Yes Historical Provider, MD  traMADol (ULTRAM) 50 MG tablet Take 50 mg by mouth every 6 (six) hours as needed for moderate pain.   Yes Historical Provider, MD   BP 121/79  Pulse 83  Temp(Src) 98.2 F (36.8 C) (Oral)  Resp 12  SpO2 96%  LMP 11/27/2013 Physical Exam  Nursing note and vitals reviewed. Constitutional: She is oriented to person, place, and time. She appears well-developed and well-nourished. No distress.  HENT:  Head: Normocephalic and atraumatic.  Mouth/Throat: Oropharynx is clear and moist.  Eyes: Conjunctivae are normal. Pupils are equal, round, and reactive to light. No scleral icterus.  Neck: Neck supple.  Cardiovascular: Normal rate, regular rhythm, normal heart sounds and intact distal pulses.   No murmur heard. Pulmonary/Chest: Effort normal and breath sounds normal. No stridor. No respiratory distress. She has no rales.  Abdominal: Soft. Bowel sounds are normal. She exhibits no distension. There is no tenderness.  Musculoskeletal: Normal range of motion.  Neurological: She is alert and oriented to person, place, and time. She has normal strength. A cranial nerve deficit (right face sensory deficit, otherwise normal) is present. No sensory deficit. GCS eye subscore is 4. GCS verbal subscore is 5. GCS motor subscore is 6.  Skin: Skin is warm and dry. No rash noted.  Psychiatric: She has a normal mood and affect. Her behavior is normal.    ED Course  Procedures (including critical  care time) Labs Review Labs Reviewed  POC URINE PREG, ED    Imaging Review Ct Head Wo Contrast  12/17/2013   CLINICAL DATA:  Headache, nausea, dizziness the patient is status post right craniotomy tumor removal  EXAM: CT HEAD WITHOUT CONTRAST  TECHNIQUE: Contiguous axial images were obtained from the base of the skull through the vertex without intravenous contrast.  COMPARISON:  None.  FINDINGS: The patient is status post right craniotomy and right sphenoidectomy. No intracranial hemorrhage, mass effect or midline shift. No hydrocephalus. No mass lesion is noted on this unenhanced scan. No acute cortical infarction.  Paranasal sinuses and mastoid air cells are unremarkable.  IMPRESSION: Status post right craniotomy and right sphenoidectomy. No intracranial hemorrhage, mass effect or midline shift. No acute cortical infarction.   Electronically Signed   By: Natasha MeadLiviu  Pop M.D.   On: 12/17/2013 13:22  All radiology studies independently viewed by me.      EKG Interpretation None      MDM   Final diagnoses:  Intractable headache, unspecified chronicity pattern, unspecified headache type    38 yo female with history of headaches associated with her sphenoid meningioma and its treatment.  This headache similar to prior in all ways except it has lasted longer and she has left sided neck pain.  Presentation not consistent with meningitis or SAH.  Plan IV metoclopramide, diphenhydramine, dexamethasone, and fluids and head CT.  Head CT negative.  Added toradol.  Pain much improved.  Plan dc home, pcp follow up.  Return precautions given.    Candyce ChurnJohn David Marquerite Forsman III, MD 12/17/13 [phone]

## 2013-12-17 NOTE — ED Notes (Signed)
Upon further assesment, pt had brain tumor removed in July 2014, had radiation in Nov 2014, and last scan in April 2015 showed no new growth.

## 2013-12-17 NOTE — ED Notes (Addendum)
Pt states that she started getting a headache last Tuesday and it has been constant ever since with periods of increased severity with off and on dizziness. Pt also states that she has been dealing with double-vision since her brain tumor removal last year, but got worse when the headache began. She states she began to feel nauseated today. Pt also states that her hands are swollen.

## 2013-12-17 NOTE — Progress Notes (Signed)
  CARE MANAGEMENT ED NOTE 12/17/2013  Patient:  Stephanie [name],Stephanie [name]   Account Number:  [id number]  Date Initiated:  12/17/2013  Documentation initiated by:  Edd ArbourGIBBS,Jaunice Mirza  Subjective/Objective Assessment:   38 yr old [employer] c/o a headache last Tuesday and it has been constant ever since with periods of increased severity with off and on dizziness, double-vision since her brain tumor removal last yr, nauseated, hands are swelling     Subjective/Objective Assessment Detail:   pcp is Anna GenreStephanie [name] per pt     Action/Plan:   ED CM spoke with pt and female at bedside updated EPIC   Action/Plan Detail:   Anticipated DC Date:  12/17/2013     Status Recommendation to Physician:   Result of Recommendation:    Other ED Services  Consult Working Plan    DC Planning Services  Other  PCP issues  Outpatient Services - Pt will follow up    Choice offered to / List presented to:            Status of service:  Completed, signed off  ED Comments:   ED Comments Detail:

## 2014-04-10 ENCOUNTER — Emergency Department (HOSPITAL_COMMUNITY)
Admission: EM | Admit: 2014-04-10 | Discharge: 2014-04-10 | Disposition: A | Discharge status: Home / Self Care | Payer: 59 | Attending: Emergency Medicine | Admitting: Emergency Medicine

## 2014-04-10 ENCOUNTER — Encounter (HOSPITAL_COMMUNITY): Payer: Self-pay | Admitting: Emergency Medicine

## 2014-04-10 ENCOUNTER — Emergency Department (HOSPITAL_COMMUNITY): Payer: 59

## 2014-04-10 DIAGNOSIS — Z791 Long term (current) use of non-steroidal anti-inflammatories (NSAID): Secondary | ICD-10-CM | POA: Diagnosis not present

## 2014-04-10 DIAGNOSIS — Y998 Other external cause status: Secondary | ICD-10-CM | POA: Insufficient documentation

## 2014-04-10 DIAGNOSIS — S199XXA Unspecified injury of neck, initial encounter: Secondary | ICD-10-CM | POA: Diagnosis present

## 2014-04-10 DIAGNOSIS — S161XXA Strain of muscle, fascia and tendon at neck level, initial encounter: Secondary | ICD-10-CM | POA: Insufficient documentation

## 2014-04-10 DIAGNOSIS — S3992XA Unspecified injury of lower back, initial encounter: Secondary | ICD-10-CM | POA: Diagnosis not present

## 2014-04-10 DIAGNOSIS — Z87442 Personal history of urinary calculi: Secondary | ICD-10-CM | POA: Diagnosis not present

## 2014-04-10 DIAGNOSIS — Y9389 Activity, other specified: Secondary | ICD-10-CM | POA: Insufficient documentation

## 2014-04-10 DIAGNOSIS — Y92410 Unspecified street and highway as the place of occurrence of the external cause: Secondary | ICD-10-CM | POA: Diagnosis not present

## 2014-04-10 DIAGNOSIS — Z91040 Latex allergy status: Secondary | ICD-10-CM | POA: Diagnosis not present

## 2014-04-10 MED ORDER — HYDROCODONE-ACETAMINOPHEN 5-325 MG PO TABS
2.0000 | ORAL_TABLET | Freq: Once | ORAL | Status: AC
  Administered 2014-04-10: 1 via ORAL
  Filled 2014-04-10: qty 2

## 2014-04-10 MED ORDER — HYDROCODONE-ACETAMINOPHEN 5-325 MG PO TABS: ORAL_TABLET | ORAL | Status: DC
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
## 2014-04-10 NOTE — Discharge Instructions (Signed)
Please follow with your primary care doctor in the next 2 days for a check-up. They must obtain records for further management.   Do not hesitate to return to the Emergency Department for any new, worsening or concerning symptoms.   Take vicodin for breakthrough pain, do not drink alcohol, drive, care for children or do other critical tasks while taking vicodin.   Cervical Sprain A cervical sprain is an injury in the neck in which the strong, fibrous tissues (ligaments) that connect your neck bones stretch or tear. Cervical sprains can range from mild to severe. Severe cervical sprains can cause the neck vertebrae to be unstable. This can lead to damage of the spinal cord and can result in serious nervous system problems. The amount of time it takes for a cervical sprain to get better depends on the cause and extent of the injury. Most cervical sprains heal in 1 to 3 weeks. CAUSES  Severe cervical sprains may be caused by:   Contact sport injuries (such as from football, rugby, wrestling, hockey, auto racing, gymnastics, diving, martial arts, or boxing).   Motor vehicle collisions.   Whiplash injuries. This is an injury from a sudden forward and backward whipping movement of the head and neck.  Falls.  Mild cervical sprains may be caused by:   Being in an awkward position, such as while cradling a telephone between your ear and shoulder.   Sitting in a chair that does not offer proper support.   Working at a poorly Marketing executivedesigned computer station.   Looking up or down for long periods of time.  SYMPTOMS   Pain, soreness, stiffness, or a burning sensation in the front, back, or sides of the neck. This discomfort may develop immediately after the injury or slowly, 24 hours or more after the injury.   Pain or tenderness directly in the middle of the back of the neck.   Shoulder or upper back pain.   Limited ability to move the neck.   Headache.   Dizziness.   Weakness,  numbness, or tingling in the hands or arms.   Muscle spasms.   Difficulty swallowing or chewing.   Tenderness and swelling of the neck.  DIAGNOSIS  Most of the time your health care provider can diagnose a cervical sprain by taking your history and doing a physical exam. Your health care provider will ask about previous neck injuries and any known neck problems, such as arthritis in the neck. X-rays may be taken to find out if there are any other problems, such as with the bones of the neck. Other tests, such as a CT scan or MRI, may also be needed.  TREATMENT  Treatment depends on the severity of the cervical sprain. Mild sprains can be treated with rest, keeping the neck in place (immobilization), and pain medicines. Severe cervical sprains are immediately immobilized. Further treatment is done to help with pain, muscle spasms, and other symptoms and may include:  Medicines, such as pain relievers, numbing medicines, or muscle relaxants.   Physical therapy. This may involve stretching exercises, strengthening exercises, and posture training. Exercises and improved posture can help stabilize the neck, strengthen muscles, and help stop symptoms from returning.  HOME CARE INSTRUCTIONS   Put ice on the injured area.   Put ice in a plastic bag.   Place a towel between your skin and the bag.   Leave the ice on for 15-20 minutes, 3-4 times a day.   If your injury was severe, you  may have been given a cervical collar to wear. A cervical collar is a two-piece collar designed to keep your neck from moving while it heals.  Do not remove the collar unless instructed by your health care provider.  If you have long hair, keep it outside of the collar.  Ask your health care provider before making any adjustments to your collar. Minor adjustments may be required over time to improve comfort and reduce pressure on your chin or on the back of your head.  Ifyou are allowed to remove the  collar for cleaning or bathing, follow your health care provider's instructions on how to do so safely.  Keep your collar clean by wiping it with mild soap and water and drying it completely. If the collar you have been given includes removable pads, remove them every 1-2 days and hand wash them with soap and water. Allow them to air dry. They should be completely dry before you wear them in the collar.  If you are allowed to remove the collar for cleaning and bathing, wash and dry the skin of your neck. Check your skin for irritation or sores. If you see any, tell your health care provider.  Do not drive while wearing the collar.   Only take over-the-counter or prescription medicines for pain, discomfort, or fever as directed by your health care provider.   Keep all follow-up appointments as directed by your health care provider.   Keep all physical therapy appointments as directed by your health care provider.   Make any needed adjustments to your workstation to promote good posture.   Avoid positions and activities that make your symptoms worse.   Warm up and stretch before being active to help prevent problems.  SEEK MEDICAL CARE IF:   Your pain is not controlled with medicine.   You are unable to decrease your pain medicine over time as planned.   Your activity level is not improving as expected.  SEEK IMMEDIATE MEDICAL CARE IF:   You develop any bleeding.  You develop stomach upset.  You have signs of an allergic reaction to your medicine.   Your symptoms get worse.   You develop new, unexplained symptoms.   You have numbness, tingling, weakness, or paralysis in any part of your body.  MAKE SURE YOU:   Understand these instructions.  Will watch your condition.  Will get help right away if you are not doing well or get worse. Document Released: 03/07/2007 Document Revised: 05/15/2013 Document Reviewed: 11/15/2012 [facility] Patient Information 2015  [place] [employer], [place]. This information is not intended to replace advice given to you by your health care provider. Make sure you discuss any questions you have with your health care provider.
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
## 2014-04-10 NOTE — ED Notes (Signed)
Pt refused second vicodin.

## 2014-06-19 ENCOUNTER — Encounter (HOSPITAL_COMMUNITY): Payer: Self-pay | Admitting: Emergency Medicine

## 2014-06-19 ENCOUNTER — Emergency Department (HOSPITAL_COMMUNITY)
Admission: EM | Admit: 2014-06-19 | Discharge: 2014-06-20 | Discharge status: Against Medical Advice | Payer: 59 | Attending: Emergency Medicine | Admitting: Emergency Medicine

## 2014-06-19 ENCOUNTER — Emergency Department (HOSPITAL_COMMUNITY): Payer: 59

## 2014-06-19 ENCOUNTER — Encounter (HOSPITAL_COMMUNITY): Payer: Self-pay | Admitting: Adult Health

## 2014-06-19 ENCOUNTER — Emergency Department (INDEPENDENT_AMBULATORY_CARE_PROVIDER_SITE_OTHER)
Admission: EM | Admit: 2014-06-19 | Discharge: 2014-06-19 | Disposition: A | Discharge status: Nursing Home (Includes ICF) | Payer: 59 | Source: Home / Self Care | Attending: Family Medicine | Admitting: Family Medicine

## 2014-06-19 DIAGNOSIS — Z87442 Personal history of urinary calculi: Secondary | ICD-10-CM | POA: Insufficient documentation

## 2014-06-19 DIAGNOSIS — Z791 Long term (current) use of non-steroidal anti-inflammatories (NSAID): Secondary | ICD-10-CM | POA: Diagnosis not present

## 2014-06-19 DIAGNOSIS — R109 Unspecified abdominal pain: Secondary | ICD-10-CM

## 2014-06-19 DIAGNOSIS — R1030 Lower abdominal pain, unspecified: Secondary | ICD-10-CM

## 2014-06-19 DIAGNOSIS — Z91040 Latex allergy status: Secondary | ICD-10-CM | POA: Insufficient documentation

## 2014-06-19 DIAGNOSIS — K5900 Constipation, unspecified: Secondary | ICD-10-CM | POA: Insufficient documentation

## 2014-06-19 LAB — CBC WITH DIFFERENTIAL/PLATELET
BASOS ABS: 0 10*3/uL (ref 0.0–0.1)
Basophils Relative: 0 % (ref 0–1)
EOS PCT: 2 % (ref 0–5)
Eosinophils Absolute: 0.3 10*3/uL (ref 0.0–0.7)
HEMATOCRIT: 38.4 % (ref 36.0–46.0)
HEMOGLOBIN: 12.7 g/dL (ref 12.0–15.0)
LYMPHS PCT: 31 % (ref 12–46)
Lymphs Abs: 3.5 10*3/uL (ref 0.7–4.0)
MCH: 28 pg (ref 26.0–34.0)
MCHC: 33.1 g/dL (ref 30.0–36.0)
MCV: 84.8 fL (ref 78.0–100.0)
MONOS PCT: 5 % (ref 3–12)
Monocytes Absolute: 0.6 10*3/uL (ref 0.1–1.0)
Neutro Abs: 6.8 10*3/uL (ref 1.7–7.7)
Neutrophils Relative %: 62 % (ref 43–77)
Platelets: 249 10*3/uL (ref 150–400)
RBC: 4.53 MIL/uL (ref 3.87–5.11)
RDW: 14.5 % (ref 11.5–15.5)
WBC: 11.3 10*3/uL — ABNORMAL HIGH (ref 4.0–10.5)

## 2014-06-19 LAB — POCT URINALYSIS DIP (DEVICE)
BILIRUBIN URINE: NEGATIVE
Glucose, UA: NEGATIVE mg/dL
KETONES UR: NEGATIVE mg/dL
LEUKOCYTES UA: NEGATIVE
NITRITE: NEGATIVE
PROTEIN: NEGATIVE mg/dL
SPECIFIC GRAVITY, URINE: 1.025 (ref 1.005–1.030)
Urobilinogen, UA: 1 mg/dL (ref 0.0–1.0)
pH: 7 (ref 5.0–8.0)

## 2014-06-19 LAB — COMPREHENSIVE METABOLIC PANEL
ALT: 18 U/L (ref 0–35)
ANION GAP: 6 (ref 5–15)
AST: 19 U/L (ref 0–37)
Albumin: 4.2 g/dL (ref 3.5–5.2)
Alkaline Phosphatase: 71 U/L (ref 39–117)
BILIRUBIN TOTAL: 0.6 mg/dL (ref 0.3–1.2)
CO2: 26 mmol/L (ref 19–32)
CREATININE: 0.74 mg/dL (ref 0.50–1.10)
Calcium: 9.5 mg/dL (ref 8.4–10.5)
Chloride: 108 mmol/L (ref 96–112)
GFR calc Af Amer: >90 mL/min (ref >90)
GFR calc non Af Amer: >90 mL/min (ref >90)
Glucose, Bld: 90 mg/dL (ref 70–99)
Potassium: 4.2 mmol/L (ref 3.5–5.1)
Sodium: 140 mmol/L (ref 135–145)
Total Protein: 7 g/dL (ref 6.0–8.3)

## 2014-06-19 LAB — POCT PREGNANCY, URINE: PREG TEST UR: NEGATIVE

## 2014-06-19 MED ORDER — ONDANSETRON HCL 4 MG/2ML IJ SOLN
4.0000 mg | Freq: Once | INTRAMUSCULAR | Status: AC
  Administered 2014-06-19: 4 mg via INTRAVENOUS
  Filled 2014-06-19: qty 2

## 2014-06-19 MED ORDER — IOHEXOL 300 MG/ML  SOLN
25.0000 mL | Freq: Once | INTRAMUSCULAR | Status: AC | PRN
  Administered 2014-06-19: 25 mL via ORAL

## 2014-06-19 NOTE — ED Notes (Signed)
Presents with lower back pain, lower abdominal pain, nausea and emesis x3 in 2 days. LAst BM was today but she reports chronic constipation and when she has a BM it is very small. "It has never been like this before and I have tried laxatives and suppositories. I am in so much pain and I feel like if I could have a good BM it would be better. I went to pharmacy to buy an enema and they told me I needed to be seen. I am just so uncomfortable" [facility].

## 2014-06-19 NOTE — ED Provider Notes (Addendum)
CSN: [id number]     Arrival date & time 06/19/14  1856 History   First MD Initiated Contact with Patient 06/19/14 2129     Chief Complaint  Patient presents with  . Constipation  . Abdominal Pain     (Consider location/radiation/quality/duration/timing/severity/associated sxs/prior Treatment) HPI Comments: Pt presents with 1-2 weeks of a constant burning low abdominal pain with intermittent cramping pains with radiation to bilateral sides back with associated nausea and vomiting.  She is also reported intermittent diarrhea and constipation.  There has been more prominently constipation and she does not feel that she has had a normal sized bowel movement for 3 weeks.  She reports subjective fevers and low-grade temperature at home, denies urinary symptoms, vaginal bleeding or discharge.  She's a history of brain tumor excision and radiation as well as kidney stones.  She has had intermittent diarrhea and constipation since that procedure, but states she's never had constipation this bad.   Past Medical History  Diagnosis Date  . Environmental allergies   . Kidney stones    Past Surgical History  Procedure Laterality Date  . Tonsillectomy    . Sphenoidectomy  11/2012  . Brain tumor excision     History reviewed. No pertinent family history. History  Substance Use Topics  . Smoking status: Never Smoker   . Smokeless tobacco: Not on file  . Alcohol Use: Yes     Comment: social   OB History    No data available     Review of Systems  Constitutional: Negative for fever, chills, diaphoresis, activity change, appetite change and fatigue.  HENT: Negative for congestion, facial swelling, rhinorrhea and sore throat.   Eyes: Negative for photophobia and discharge.  Respiratory: Negative for cough, chest tightness and shortness of breath.   Cardiovascular: Negative for chest pain, palpitations and leg swelling.  Gastrointestinal: Positive for abdominal pain, diarrhea, constipation and  abdominal distention. Negative for nausea and vomiting.  Endocrine: Negative for polydipsia and polyuria.  Genitourinary: Negative for dysuria, frequency, difficulty urinating and pelvic pain.  Musculoskeletal: Negative for back pain, arthralgias, neck pain and neck stiffness.  Skin: Negative for color change and wound.  Allergic/Immunologic: Negative for immunocompromised state.  Neurological: Negative for facial asymmetry, weakness, numbness and headaches.  Hematological: Does not bruise/bleed easily.  Psychiatric/Behavioral: Negative for confusion and agitation.      Allergies  Flonase and Latex  Home Medications   Prior to Admission medications   Medication Sig Start Date End Date Taking? Authorizing Provider  HYDROcodone-acetaminophen (NORCO/VICODIN) 5-325 MG per tablet Take 1-2 tablets by mouth every 6 hours as needed for pain. 04/10/14   Nicole Pisciotta, PA-C  ibuprofen (ADVIL,MOTRIN) 600 MG tablet Take 600 mg by mouth every 6 (six) hours as needed for headache or moderate pain.    Historical Provider, MD  levonorgestrel (MIRENA) 20 MCG/24HR IUD 1 each by Intrauterine route once.    Historical Provider, MD  meloxicam (MOBIC) 15 MG tablet Take 15 mg by mouth daily.    Historical Provider, MD  polyethylene glycol (MIRALAX / GLYCOLAX) packet Take 17 g by mouth daily. Take 3 doses in a large Gatorade and drink over the course of 1 day. Decrease to once daily for one week when stool is soft. 06/20/14   Toy Cookey, MD  traMADol (ULTRAM) 50 MG tablet Take 50 mg by mouth every 6 (six) hours as needed for moderate pain.    Historical Provider, MD   BP 114/77 mmHg  Pulse 92  Temp(Src) 98.7  F (37.1 C)  Resp 14  SpO2 99%  LMP 05/25/2014 Physical Exam  Constitutional: She is oriented to person, place, and time. She appears well-developed and well-nourished. No distress.  HENT:  Head: Normocephalic and atraumatic.  Mouth/Throat: No oropharyngeal exudate.  Eyes: Pupils are equal,  round, and reactive to light.  Neck: Normal range of motion. Neck supple.  Cardiovascular: Normal rate, regular rhythm and normal heart sounds.  Exam reveals no gallop and no friction rub.   No murmur heard. Pulmonary/Chest: Effort normal and breath sounds normal. No respiratory distress. She has no wheezes. She has no rales.  Abdominal: Soft. Bowel sounds are normal. She exhibits no distension and no mass. There is tenderness in the right lower quadrant, suprapubic area and left lower quadrant. There is no rebound and no guarding.  Musculoskeletal: Normal range of motion. She exhibits no edema or tenderness.  Neurological: She is alert and oriented to person, place, and time.  Skin: Skin is warm and dry.  Psychiatric: She has a normal mood and affect.    ED Course  Procedures (including critical care time) Labs Review Labs Reviewed  CBC WITH DIFFERENTIAL/PLATELET - Abnormal; Notable for the following:    WBC 11.3 (*)    All other components within normal limits  COMPREHENSIVE METABOLIC PANEL - Abnormal; Notable for the following:    BUN <5 (*)    All other components within normal limits    Imaging Review Dg Abd 1 View  06/19/2014   CLINICAL DATA:  Initial encounter for nausea with constipation and lower abdominal pain for 1 week.  EXAM: ABDOMEN - 1 VIEW  COMPARISON:  None.  FINDINGS: Supine abdomen shows no gaseous bowel dilatation to suggest obstruction. No substantial stool volume to suggest clinical constipation. Mild convex leftward of lumbar scoliosis is evident. IUD projects over the central anatomic pelvis.  IMPRESSION: Negative.   Electronically Signed   By: Kennith Center M.D.   On: 06/19/2014 20:23     EKG Interpretation None      MDM   Final diagnoses:  Lower abdominal pain  Constipation, unspecified constipation type    Pt is a 39 y.o. female with Pmhx as above who presents with 1-2 weeks of a constant burning low abdominal pain with intermittent cramping pains  with radiation to bilateral sides back with associated nausea and vomiting.  She is also reported intermittent diarrhea and constipation.  There has been more prominently constipation and she does not feel that she has had a normal sized bowel movement for 3 weeks.  She reports subjective fevers and low-grade temperature at home, denies urinary symptoms, vaginal bleeding or discharge.  She's a history of brain tumor excision and radiation as well as kidney stones.  She has had intermittent diarrhea and constipation since that procedure, but states she's never had constipation this bad.  On physical exam vitals are stable and she is in no acute distress.  She is positive bilateral lower quadrant tenderness palpation without rebound or guarding as well as left CVA tenderness.  Acute abdominal series does not show significant stool consistent with patient's history.  She also has a mild white blood cell count elevation and given low-grade temperature at urgent care today, I am not confident that symptoms can be said to be consistent with just constipation.  CT abdomen and pelvis ordered. (Is a point-of-care.  UA with trace blood and point-of-care Pred negative at urgent care today)  Dr. Preston Fleeting will follow CT read. If normal, plan  to d/c home with miralax.   Patient has eloped without having CT scan or obtaining her paperwork.   Matthias Hughsebecca G Matassa evaluation in the Emergency Department is complete. It has been determined that no acute conditions requiring further emergency intervention are present at this time. The patient/guardian have been advised of the diagnosis and plan. We have discussed signs and symptoms that warrant return to the ED, such as changes or worsening in symptoms, worsening pain, fever, inability to tolerate liquids.       Toy CookeyMegan Docherty, MD 06/20/14 [id number]  Toy CookeyMegan Docherty, MD 06/20/14 1252

## 2014-06-19 NOTE — ED Notes (Signed)
[facility] shuttle service[facility] Art[facility] FlockKindl.

## 2014-06-19 NOTE — ED Provider Notes (Signed)
CSN: [id number]     Arrival date & time 06/19/14  1640 History   First MD Initiated Contact with Patient 06/19/14 1822     Chief Complaint  Patient presents with  . Abdominal Pain  . Back Pain   (Consider location/radiation/quality/duration/timing/severity/associated sxs/prior Treatment) Patient is a 39 y.o. female presenting with abdominal pain. The history is provided by the patient.  Abdominal Pain Pain location:  Generalized Pain quality: bloating, cramping and shooting   Pain radiates to:  Back Pain severity:  Moderate Onset quality:  Gradual Duration:  3 weeks Timing:  Constant Progression:  Worsening Chronicity:  Recurrent Context: laxative use   Context comment:  Last nl bm 3 weeks ago , no blood seen. Relieved by:  Nothing Ineffective treatments:  Belching and OTC medications (laxative use) Associated symptoms: anorexia, belching, constipation, diarrhea, fever, nausea and vomiting     Past Medical History  Diagnosis Date  . Environmental allergies   . Kidney stones    Past Surgical History  Procedure Laterality Date  . Tonsillectomy    . Sphenoidectomy  11/2012  . Brain tumor excision     No family history on file. History  Substance Use Topics  . Smoking status: Never Smoker   . Smokeless tobacco: Not on file  . Alcohol Use: Yes     Comment: social   OB History    No data available     Review of Systems  Constitutional: Positive for fever and appetite change.  Gastrointestinal: Positive for nausea, vomiting, abdominal pain, diarrhea, constipation and anorexia.    Allergies  Flonase and Latex  Home Medications   Prior to Admission medications   Medication Sig Start Date End Date Taking? Authorizing Provider  traMADol (ULTRAM) 50 MG tablet Take 50 mg by mouth every 6 (six) hours as needed for moderate pain.   Yes Historical Provider, MD  HYDROcodone-acetaminophen (NORCO/VICODIN) 5-325 MG per tablet Take 1-2 tablets by mouth every 6 hours as  needed for pain. 04/10/14   Nicole Pisciotta, PA-C  ibuprofen (ADVIL,MOTRIN) 600 MG tablet Take 600 mg by mouth every 6 (six) hours as needed for headache or moderate pain.    Historical Provider, MD  levonorgestrel (MIRENA) 20 MCG/24HR IUD 1 each by Intrauterine route once.    Historical Provider, MD  meloxicam (MOBIC) 15 MG tablet Take 15 mg by mouth daily.    Historical Provider, MD   BP 123/79 mmHg  Pulse 99  Temp(Src) 99.3 F (37.4 C) (Oral)  Resp 18  SpO2 96%  LMP 05/25/2014 Physical Exam  Constitutional: She is oriented to person, place, and time. She appears well-developed and well-nourished. She appears distressed.  Neck: Normal range of motion. Neck supple.  Abdominal: Soft. Bowel sounds are normal. She exhibits no distension and no mass. There is no hepatosplenomegaly. There is generalized tenderness. There is no rebound, no guarding and no CVA tenderness. No hernia.  Neurological: She is alert and oriented to person, place, and time.  Skin: Skin is warm and dry.  Nursing note and vitals reviewed.   ED Course  Procedures (including critical care time) Labs Review Labs Reviewed  POCT URINALYSIS DIP (DEVICE) - Abnormal; Notable for the following:    Hgb urine dipstick TRACE (*)    All other components within normal limits  POCT PREGNANCY, URINE    Imaging Review No results found.   MDM   1. Abdominal pain in female patient    Sent for abd pain w/u for >1wk, assoc fever, n/v/d.  Linna HoffJames D Oather [place], MD 06/19/14 [id number]

## 2014-06-19 NOTE — ED Notes (Signed)
C/o abd and back pain onset 1 week Sx also reports constipation, nauseas, fevers, chills, decreased appetite Also states she has not been voiding urine "as much" Alert, no signs of acute distress.

## 2014-06-20 MED ORDER — POLYETHYLENE GLYCOL 3350 17 G PO PACK: 17.0000 g | PACK | Freq: Every day | ORAL | Status: DC

## 2014-06-20 NOTE — Discharge Instructions (Signed)
Abdominal Pain, Women °Abdominal (stomach, pelvic, or belly) pain can be caused by many things. It is important to tell your doctor: °· The location of the pain. °· Does it come and go or is it present all the time? °· Are there things that start the pain (eating certain foods, exercise)? °· Are there other symptoms associated with the pain (fever, nausea, vomiting, diarrhea)? °All of this is helpful to know when trying to find the cause of the pain. °CAUSES  °· Stomach: virus or bacteria infection, or ulcer. °· Intestine: appendicitis (inflamed appendix), regional ileitis (Crohn's disease), ulcerative colitis (inflamed colon), irritable bowel syndrome, diverticulitis (inflamed diverticulum of the colon), or cancer of the stomach or intestine. °· Gallbladder disease or stones in the gallbladder. °· Kidney disease, kidney stones, or infection. °· Pancreas infection or cancer. °· Fibromyalgia (pain disorder). °· Diseases of the female organs: °¨ Uterus: fibroid (non-cancerous) tumors or infection. °¨ Fallopian tubes: infection or tubal pregnancy. °¨ Ovary: cysts or tumors. °¨ Pelvic adhesions (scar tissue). °¨ Endometriosis (uterus lining tissue growing in the pelvis and on the pelvic organs). °¨ Pelvic congestion syndrome (female organs filling up with blood just before the menstrual period). °¨ Pain with the menstrual period. °¨ Pain with ovulation (producing an egg). °¨ Pain with an IUD (intrauterine device, birth control) in the uterus. °¨ Cancer of the female organs. °· Functional pain (pain not caused by a disease, may improve without treatment). °· Psychological pain. °· Depression. °DIAGNOSIS  °Your doctor will decide the seriousness of your pain by doing an examination. °· Blood tests. °· X-rays. °· Ultrasound. °· CT scan (computed tomography, special type of X-ray). °· MRI (magnetic resonance imaging). °· Cultures, for infection. °· Barium enema (dye inserted in the large intestine, to better view it with  X-rays). °· Colonoscopy (looking in intestine with a lighted tube). °· Laparoscopy (minor surgery, looking in abdomen with a lighted tube). °· Major abdominal exploratory surgery (looking in abdomen with a large incision). °TREATMENT  °The treatment will depend on the cause of the pain.  °· Many cases can be observed and treated at home. °· Over-the-counter medicines recommended by your caregiver. °· Prescription medicine. °· Antibiotics, for infection. °· Birth control pills, for painful periods or for ovulation pain. °· Hormone treatment, for endometriosis. °· Nerve blocking injections. °· Physical therapy. °· Antidepressants. °· Counseling with a psychologist or psychiatrist. °· Minor or major surgery. °HOME CARE INSTRUCTIONS  °· Do not take laxatives, unless directed by your caregiver. °· Take over-the-counter pain medicine only if ordered by your caregiver. Do not take aspirin because it can cause an upset stomach or bleeding. °· Try a clear liquid diet (broth or water) as ordered by your caregiver. Slowly move to a bland diet, as tolerated, if the pain is related to the stomach or intestine. °· Have a thermometer and take your temperature several times a day, and record it. °· Bed rest and sleep, if it helps the pain. °· Avoid sexual intercourse, if it causes pain. °· Avoid stressful situations. °· Keep your follow-up appointments and tests, as your caregiver orders. °· If the pain does not go away with medicine or surgery, you may try: °¨ Acupuncture. °¨ Relaxation exercises (yoga, meditation). °¨ Group therapy. °¨ Counseling. °SEEK MEDICAL CARE IF:  °· You notice certain foods cause stomach pain. °· Your home care treatment is not helping your pain. °· You need stronger pain medicine. °· You want your IUD removed. °· You feel faint or   lightheaded. °· You develop nausea and vomiting. °· You develop a rash. °· You are having side effects or an allergy to your medicine. °SEEK IMMEDIATE MEDICAL CARE IF:  °· Your  pain does not go away or gets worse. °· You have a fever. °· Your pain is felt only in portions of the abdomen. The right side could possibly be appendicitis. The left lower portion of the abdomen could be colitis or diverticulitis. °· You are passing blood in your stools (bright red or black tarry stools, with or without vomiting). °· You have blood in your urine. °· You develop chills, with or without a fever. °· You pass out. °MAKE SURE YOU:  °· Understand these instructions. °· Will watch your condition. °· Will get help right away if you are not doing well or get worse. °Document Released: 03/07/2007 Document Revised: 09/24/2013 Document Reviewed: 03/27/2009 °Exit[employer]® Patient Information ©2015 [employer]. This information is not intended to replace advice given to you by your health care provider. Make sure you discuss any questions you have with your health care provider. ° °Constipation °Constipation is when a person has fewer than three bowel movements a week, has difficulty having a bowel movement, or has stools that are dry, hard, or larger than normal. As people grow older, constipation is more common. If you try to fix constipation with medicines that make you have a bowel movement (laxatives), the problem may get worse. Long-term laxative use may cause the muscles of the colon to become weak. A low-fiber diet, not taking in enough fluids, and taking certain medicines may make constipation worse.  °CAUSES  °· Certain medicines, such as antidepressants, pain medicine, iron supplements, antacids, and water pills.   °· Certain diseases, such as diabetes, irritable bowel syndrome (IBS), thyroid disease, or depression.   °· Not drinking enough water.   °· Not eating enough fiber-rich foods.   °· Stress or travel.   °· Lack of physical activity or exercise.   °· Ignoring the urge to have a bowel movement.   °· Using laxatives too much.   °SIGNS AND SYMPTOMS  °· Having fewer than three bowel movements a  week.   °· Straining to have a bowel movement.   °· Having stools that are hard, dry, or larger than normal.   °· Feeling full or bloated.   °· Pain in the lower abdomen.   °· Not feeling relief after having a bowel movement.   °DIAGNOSIS  °Your health care provider will take a medical history and perform a physical exam. Further testing may be done for severe constipation. Some tests may include: °· A barium enema X-ray to examine your rectum, colon, and, sometimes, your small intestine.   °· A sigmoidoscopy to examine your lower colon.   °· A colonoscopy to examine your entire colon. °TREATMENT  °Treatment will depend on the severity of your constipation and what is causing it. Some dietary treatments include drinking more fluids and eating more fiber-rich foods. Lifestyle treatments may include regular exercise. If these diet and lifestyle recommendations do not help, your health care provider may recommend taking over-the-counter laxative medicines to help you have bowel movements. Prescription medicines may be prescribed if over-the-counter medicines do not work.  °HOME CARE INSTRUCTIONS  °· Eat foods that have a lot of fiber, such as fruits, vegetables, whole grains, and beans. °· Limit foods high in fat and processed sugars, such as french fries, hamburgers, cookies, candies, and soda.   °· A fiber supplement may be added to your diet if you cannot get enough fiber from foods.   °·   Drink enough fluids to keep your urine clear or pale yellow.   °· Exercise regularly or as directed by your health care provider.   °· Go to the restroom when you have the urge to go. Do not hold it.   °· Only take over-the-counter or prescription medicines as directed by your health care provider. Do not take other medicines for constipation without talking to your health care provider first.   °SEEK IMMEDIATE MEDICAL CARE IF:  °· You have bright red blood in your stool.   °· Your constipation lasts for more than 4 days or gets  worse.   °· You have abdominal or rectal pain.   °· You have thin, pencil-like stools.   °· You have unexplained weight loss. °MAKE SURE YOU:  °· Understand these instructions. °· Will watch your condition. °· Will get help right away if you are not doing well or get worse. °Document Released: 02/06/2004 Document Revised: 05/15/2013 Document Reviewed: 02/19/2013 °ExitCare® Patient Information ©2015 [employer]. This information is not intended to replace advice given to you by your health care provider. Make sure you discuss any questions you have with your health care provider. ° °

## 2014-06-20 NOTE — ED Notes (Signed)
Pt. pulled her IV and advised RN that she is leaving , EDP notified.

## 2014-11-29 ENCOUNTER — Encounter (HOSPITAL_COMMUNITY)
Admission: RE | Disposition: A | Discharge status: Home / Self Care | Payer: Self-pay | Source: Ambulatory Visit | Attending: Obstetrics and Gynecology

## 2014-11-29 ENCOUNTER — Ambulatory Visit (HOSPITAL_COMMUNITY): Payer: 59 | Admitting: Anesthesiology

## 2014-11-29 ENCOUNTER — Ambulatory Visit (HOSPITAL_COMMUNITY)
Admission: RE | Admit: 2014-11-29 | Discharge: 2014-11-29 | Disposition: A | Discharge status: Home / Self Care | Payer: 59 | Source: Ambulatory Visit | Attending: Obstetrics and Gynecology | Admitting: Obstetrics and Gynecology

## 2014-11-29 ENCOUNTER — Encounter (HOSPITAL_COMMUNITY): Payer: Self-pay | Admitting: *Deleted

## 2014-11-29 DIAGNOSIS — Z3A08 8 weeks gestation of pregnancy: Secondary | ICD-10-CM | POA: Diagnosis not present

## 2014-11-29 DIAGNOSIS — O021 Missed abortion: Secondary | ICD-10-CM | POA: Diagnosis present

## 2014-11-29 HISTORY — PX: DILATION AND EVACUATION: SHX1459

## 2014-11-29 LAB — CBC
HCT: 34.4 % — ABNORMAL LOW (ref 36.0–46.0)
Hemoglobin: 11.4 g/dL — ABNORMAL LOW (ref 12.0–15.0)
MCH: 28.9 pg (ref 26.0–34.0)
MCHC: 33.1 g/dL (ref 30.0–36.0)
MCV: 87.3 fL (ref 78.0–100.0)
Platelets: 290 10*3/uL (ref 150–400)
RBC: 3.94 MIL/uL (ref 3.87–5.11)
RDW: 14.1 % (ref 11.5–15.5)
WBC: 13.6 10*3/uL — ABNORMAL HIGH (ref 4.0–10.5)

## 2014-11-29 SURGERY — DILATION AND EVACUATION, UTERUS
Anesthesia: Monitor Anesthesia Care | Site: Vagina

## 2014-11-29 MED ORDER — DEXAMETHASONE SODIUM PHOSPHATE 10 MG/ML IJ SOLN
INTRAMUSCULAR | Status: DC | PRN
  Administered 2014-11-29: 4 mg via INTRAVENOUS

## 2014-11-29 MED ORDER — PROPOFOL 10 MG/ML IV BOLUS
INTRAVENOUS | Status: AC
  Filled 2014-11-29: qty 20

## 2014-11-29 MED ORDER — MIDAZOLAM HCL 2 MG/2ML IJ SOLN
INTRAMUSCULAR | Status: DC | PRN
Start: 2014-11-29 — End: 2014-11-29
  Administered 2014-11-29: 2 mg via INTRAVENOUS

## 2014-11-29 MED ORDER — MIDAZOLAM HCL 2 MG/2ML IJ SOLN
INTRAMUSCULAR | Status: AC
  Filled 2014-11-29: qty 2

## 2014-11-29 MED ORDER — LIDOCAINE HCL 2 % IJ SOLN
INTRAMUSCULAR | Status: DC | PRN
  Administered 2014-11-29: 16 mL

## 2014-11-29 MED ORDER — DEXTROSE 5 % IV SOLN
100.0000 mg | Freq: Once | INTRAVENOUS | Status: AC
  Administered 2014-11-29: 100 mg via INTRAVENOUS
  Filled 2014-11-29: qty 100

## 2014-11-29 MED ORDER — FENTANYL CITRATE (PF) 100 MCG/2ML IJ SOLN
INTRAMUSCULAR | Status: DC | PRN
  Administered 2014-11-29 (×2): 50 ug via INTRAVENOUS

## 2014-11-29 MED ORDER — ONDANSETRON HCL 4 MG/2ML IJ SOLN
INTRAMUSCULAR | Status: DC | PRN
  Administered 2014-11-29: 4 mg via INTRAVENOUS

## 2014-11-29 MED ORDER — LIDOCAINE HCL (CARDIAC) 20 MG/ML IV SOLN
INTRAVENOUS | Status: AC
  Filled 2014-11-29: qty 5

## 2014-11-29 MED ORDER — SCOPOLAMINE 1 MG/3DAYS TD PT72
1.0000 | MEDICATED_PATCH | Freq: Once | TRANSDERMAL | Status: DC
  Administered 2014-11-29: 1.5 mg via TRANSDERMAL

## 2014-11-29 MED ORDER — DEXAMETHASONE SODIUM PHOSPHATE 4 MG/ML IJ SOLN
INTRAMUSCULAR | Status: AC
  Filled 2014-11-29: qty 1

## 2014-11-29 MED ORDER — FENTANYL CITRATE (PF) 100 MCG/2ML IJ SOLN
INTRAMUSCULAR | Status: AC
  Filled 2014-11-29: qty 2

## 2014-11-29 MED ORDER — SCOPOLAMINE 1 MG/3DAYS TD PT72
MEDICATED_PATCH | TRANSDERMAL | Status: AC
  Filled 2014-11-29: qty 1

## 2014-11-29 MED ORDER — LACTATED RINGERS IV SOLN
INTRAVENOUS | Status: DC
  Administered 2014-11-29: 11:00:00 via INTRAVENOUS

## 2014-11-29 MED ORDER — KETOROLAC TROMETHAMINE 30 MG/ML IJ SOLN
INTRAMUSCULAR | Status: AC
  Filled 2014-11-29: qty 1

## 2014-11-29 MED ORDER — ONDANSETRON HCL 4 MG/2ML IJ SOLN
INTRAMUSCULAR | Status: AC
  Filled 2014-11-29: qty 2

## 2014-11-29 MED ORDER — LIDOCAINE HCL (CARDIAC) 20 MG/ML IV SOLN
INTRAVENOUS | Status: DC | PRN
  Administered 2014-11-29: 30 mg via INTRAVENOUS

## 2014-11-29 MED ORDER — PROPOFOL 10 MG/ML IV BOLUS
INTRAVENOUS | Status: DC | PRN
  Administered 2014-11-29: 60 mg via INTRAVENOUS
  Administered 2014-11-29 (×2): 40 mg via INTRAVENOUS
  Administered 2014-11-29: 20 mg via INTRAVENOUS
  Administered 2014-11-29: 30 mg via INTRAVENOUS

## 2014-11-29 MED ORDER — KETOROLAC TROMETHAMINE 30 MG/ML IJ SOLN
INTRAMUSCULAR | Status: DC | PRN
  Administered 2014-11-29: 30 mg via INTRAVENOUS

## 2014-11-29 MED ORDER — LIDOCAINE HCL 2 % IJ SOLN
INTRAMUSCULAR | Status: AC
  Filled 2014-11-29: qty 20

## 2014-11-29 SURGICAL SUPPLY — 17 items
CATH ROBINSON RED A/P 16FR (CATHETERS) ×3 IMPLANT
CLOTH BEACON ORANGE TIMEOUT ST (SAFETY) ×3 IMPLANT
DECANTER SPIKE VIAL GLASS SM (MISCELLANEOUS) ×3 IMPLANT
GLOVE BIO SURGEON STRL SZ8 (GLOVE) ×3 IMPLANT
GLOVE ORTHO TXT STRL SZ7.5 (GLOVE) ×3 IMPLANT
GOWN STRL REUS W/TWL LRG LVL3 (GOWN DISPOSABLE) ×6 IMPLANT
KIT BERKELEY 1ST TRIMESTER 3/8 (MISCELLANEOUS) ×3 IMPLANT
NS IRRIG 1000ML POUR BTL (IV SOLUTION) ×3 IMPLANT
PACK VAGINAL MINOR WOMEN LF (CUSTOM PROCEDURE TRAY) ×3 IMPLANT
PAD OB MATERNITY 4.3X12.25 (PERSONAL CARE ITEMS) ×3 IMPLANT
PAD PREP 24X48 CUFFED NSTRL (MISCELLANEOUS) ×3 IMPLANT
SET BERKELEY SUCTION TUBING (SUCTIONS) ×3 IMPLANT
TOWEL OR 17X24 6PK STRL BLUE (TOWEL DISPOSABLE) ×6 IMPLANT
VACURETTE 10 RIGID CVD (CANNULA) IMPLANT
VACURETTE 7MM CVD STRL WRAP (CANNULA) IMPLANT
VACURETTE 8 RIGID CVD (CANNULA) ×3 IMPLANT
VACURETTE 9 RIGID CVD (CANNULA) IMPLANT

## 2014-11-29 NOTE — Transfer of Care (Cosign Needed)
Immediate Anesthesia Transfer of Care Note  Patient: Stephanie HughsRebecca G [name]  Procedure(s) Performed: Procedure(s): DILATATION AND EVACUATION (N/A)  Patient Location: PACU  Anesthesia Type:MAC  Level of Consciousness: awake, alert , sedated and patient cooperative  Airway & Oxygen Therapy: Patient Spontanous Breathing and Patient connected to nasal cannula oxygen  Post-op Assessment: Report given to RN and Post -op Vital signs reviewed and stable  Post vital signs: Reviewed  Last Vitals:  Filed Vitals:   11/29/14 1315  BP:   Pulse: 95  Temp:   Resp: 15    Complications: No apparent anesthesia complications

## 2014-11-29 NOTE — Anesthesia Preprocedure Evaluation (Signed)
Anesthesia Evaluation  Patient identified by MRN, date of birth, ID band Patient awake    Reviewed: Allergy & Precautions, H&P , NPO status , Patient's Chart, lab work & pertinent test results  Airway Mallampati: I  TM Distance: >3 FB Neck ROM: full    Dental no notable dental hx.    Pulmonary neg pulmonary ROS,    Pulmonary exam normal       Cardiovascular negative cardio ROS Normal cardiovascular exam    Neuro/Psych negative neurological ROS  negative psych ROS   GI/Hepatic negative GI ROS, Neg liver ROS,   Endo/Other  negative endocrine ROS  Renal/GU      Musculoskeletal   Abdominal Normal abdominal exam  (+)   Peds  Hematology negative hematology ROS (+)   Anesthesia Other Findings   Reproductive/Obstetrics (+) Pregnancy                             Anesthesia Physical Anesthesia Plan  ASA: II  Anesthesia Plan: General and MAC   Post-op Pain Management:    Induction: Intravenous  Airway Management Planned: LMA and Mask  Additional Equipment:   Intra-op Plan:   Post-operative Plan:   Informed Consent: I have reviewed the patients History and Physical, chart, labs and discussed the procedure including the risks, benefits and alternatives for the proposed anesthesia with the patient or authorized representative who has indicated his/her understanding and acceptance.     Plan Discussed with: CRNA and Surgeon  Anesthesia Plan Comments:         Anesthesia Quick Evaluation

## 2014-11-29 NOTE — Addendum Note (Signed)
Addendum  created 11/29/14 1442 by Suella Groveoderick C Breeze Berringer, CRNA   Modules edited: Anesthesia Responsible Staff

## 2014-11-29 NOTE — Op Note (Signed)
  Preoperative Diagnosis:  Missed Abortion at 8-9 weeks Postop Diagnosis:  Same Procedure:  D&E Anesthesia:  MAC, paracervical block Findings: Cervix was open with clots and tissue in the os, abundant POC on currettage Specimens: Products of conception sent for routine pathology Estimated blood loss: 50 Complications: None  Procedure in detail: The patient was taken to the operating room and placed in the dorsosupine position. IV sedation was given and she was placed and mobile stirrups. Perineum and vagina were prepped and draped in the usual sterile fashion, bladder drained with a red Robinson catheter. A Graves speculum was inserted in the vagina. The anterior lip of the cervix was grasped with a single-tooth tenaculum. Paracervical block was then performed with a total of 16 cc of 2% plain lidocaine. Uterus then sounded to 9 cm. Cervix was gradually easily dilated to size 25 dilator. A size 8 curved suction curet was then inserted without difficulty. Suction curettage was return was performed with return of abundant products of conception. Sharp curettage was performed with which revealed good uterine cry in all quadrants and no significant tissue. Suction curettage was performed one more time which revealed minimal blood. The single-tooth tenaculum was removed from the cervix and bleeding was controlled with pressure. All instruments were removed from the vagina. The patient was taken to the recovery in stable condition after tolerating the procedure well. Counts were correct, she received doxycycline at the beginning of the procedure and had PAS hose on throughout the procedure.

## 2014-11-29 NOTE — Discharge Instructions (Signed)
Routine instructions for D&E  DISCHARGE INSTRUCTIONS: D&C / D&E The following instructions have been prepared to help you care for yourself upon your return home.   Personal hygiene:  Use sanitary pads for vaginal drainage, not tampons.  Shower the day after your procedure.  NO tub baths, pools or Jacuzzis for 2-3 weeks.  Wipe front to back after using the bathroom.  Activity and limitations:  Do NOT drive or operate any equipment for 24 hours. The effects of anesthesia are still present and drowsiness may result.  Do NOT rest in bed all day.  Walking is encouraged.  Walk up and down stairs slowly.  You may resume your normal activity in one to two days or as indicated by your physician.  Sexual activity: NO intercourse for at least 2 weeks after the procedure, or as indicated by your physician.  Diet: Eat a light meal as desired this evening. You may resume your usual diet tomorrow.  Return to work: You may resume your work activities in one to two days or as indicated by your doctor.  What to expect after your surgery: Expect to have vaginal bleeding/discharge for 2-3 days and spotting for up to 10 days. It is not unusual to have soreness for up to 1-2 weeks. You may have a slight burning sensation when you urinate for the first day. Mild cramps may continue for a couple of days. You may have a regular period in 2-6 weeks.  NO IBUPROFEN PRODUCTS (MOTRIN, ADVIL) OR ALEVE UNTIL 6:15PM TODAY.   Call your doctor for any of the following:  Excessive vaginal bleeding, saturating and changing one pad every hour.  Inability to urinate 6 hours after discharge from hospital.  Pain not relieved by pain medication.  Fever of 100.4 F or greater.  Unusual vaginal discharge or odor.   Call for an appointment:    Patients signature: ______________________  Nurses signature ________________________  Support person's signature_______________________

## 2014-11-29 NOTE — Interval H&P Note (Signed)
History and Physical Interval Note:  11/29/2014 12:05 PM  Stephanie HughsRebecca G Crigler  has presented today for surgery, with the diagnosis of MISSED ABORTION  The various methods of treatment have been discussed with the patient and family. After consideration of risks, benefits and other options for treatment, the patient has consented to  Procedure(s): DILATATION AND EVACUATION (N/A) as a surgical intervention .  The patient's history has been reviewed, patient examined, no change in status, stable for surgery.  I have reviewed the patient's chart and labs.  Questions were answered to the patient's satisfaction.     Laurajean Hosek D

## 2014-11-29 NOTE — Addendum Note (Signed)
Addendum  created 11/29/14 1429 by Suella Groveoderick C Tniya Bowditch, CRNA   Modules edited: Charges VN

## 2014-11-29 NOTE — Anesthesia Postprocedure Evaluation (Signed)
Anesthesia Post Note  Patient: Stephanie HughsRebecca G [name]  Procedure(s) Performed: Procedure(s) (LRB): DILATATION AND EVACUATION (N/A)  Anesthesia type: General  Patient location: PACU  Post pain: Pain level controlled  Post assessment: Post-op Vital signs reviewed  Last Vitals:  Filed Vitals:   11/29/14 1315  BP:   Pulse: 95  Temp:   Resp: 15    Post vital signs: Reviewed  Level of consciousness: sedated  Complications: No apparent anesthesia complications

## 2014-11-29 NOTE — Addendum Note (Signed)
Addendum  created 11/29/14 1433 by Suella Groveoderick C Dustie Brittle, CRNA   Modules edited: Anesthesia Responsible Staff

## 2014-11-29 NOTE — H&P (Signed)
Stephanie HughsRebecca G [name] is an 39 y.o. female. She was seen for a routine OB visit yesterday and found to have missed abortion at 8-9 weeks.  She has been having some bleeding.  She elects to proceed with surgical treatment as she is supposed to be [redacted] weeks pregnant.  Pertinent Gynecological History: OB History: G4, P3013   Menstrual History: No LMP recorded.    Past Medical History  Diagnosis Date  . Environmental allergies   . Kidney stones     Past Surgical History  Procedure Laterality Date  . Tonsillectomy    . Sphenoidectomy  11/2012  . Brain tumor excision      No family history on file.  Social History:  reports that she has never smoked. She does not have any smokeless tobacco history on file. She reports that she drinks alcohol. Her drug history is not on file.  Allergies:  Allergies  Allergen Reactions  . Flonase [Fluticasone Propionate] Other (See Comments)    Made pt pass out  . Latex Itching and Rash    No prescriptions prior to admission    Review of Systems  Respiratory: Negative.   Cardiovascular: Negative.   Gastrointestinal: Negative.   Genitourinary: Negative.     There were no vitals taken for this visit. Physical Exam  Constitutional: She appears well-developed and well-nourished.  Cardiovascular: Normal rate, regular rhythm and normal heart sounds.   No murmur heard. Respiratory: Effort normal and breath sounds normal. No respiratory distress. She has no wheezes.  GI: Soft. She exhibits no distension. There is no tenderness.    No results found for this or any previous visit (from the past 24 hour(s)).  No results found.  Assessment/Plan: Missed Ab at 8-9 weeks, A pos blood type.  Scheduled for D&E today, will discuss procedure and risks in detail prior to procedure.  Blessing Zaucha D 11/29/2014, 8:24 AM

## 2014-12-03 ENCOUNTER — Encounter (HOSPITAL_COMMUNITY): Payer: Self-pay | Admitting: Obstetrics and Gynecology

## 2015-01-29 ENCOUNTER — Other Ambulatory Visit: Payer: Self-pay | Admitting: Family Medicine

## 2015-01-29 ENCOUNTER — Ambulatory Visit
Admission: RE | Admit: 2015-01-29 | Discharge: 2015-01-29 | Disposition: A | Discharge status: Home / Self Care | Payer: 59 | Source: Ambulatory Visit | Attending: Family Medicine | Admitting: Family Medicine

## 2015-01-29 DIAGNOSIS — M542 Cervicalgia: Secondary | ICD-10-CM

## 2015-04-14 LAB — OB RESULTS CONSOLE HEPATITIS B SURFACE ANTIGEN: HEP B S AG: NEGATIVE

## 2015-04-14 LAB — OB RESULTS CONSOLE RUBELLA ANTIBODY, IGM: RUBELLA: IMMUNE

## 2015-04-14 LAB — OB RESULTS CONSOLE ABO/RH: RH Type: POSITIVE

## 2015-04-14 LAB — OB RESULTS CONSOLE HIV ANTIBODY (ROUTINE TESTING): HIV: NONREACTIVE

## 2015-04-14 LAB — OB RESULTS CONSOLE ANTIBODY SCREEN: ANTIBODY SCREEN: NEGATIVE

## 2015-04-14 LAB — OB RESULTS CONSOLE RPR: RPR: NONREACTIVE

## 2015-07-16 ENCOUNTER — Encounter: Payer: 59 | Attending: Obstetrics and Gynecology

## 2015-07-16 VITALS — Ht 70.0 in | Wt 209.0 lb

## 2015-07-16 DIAGNOSIS — O24419 Gestational diabetes mellitus in pregnancy, unspecified control: Secondary | ICD-10-CM

## 2015-07-16 DIAGNOSIS — O99810 Abnormal glucose complicating pregnancy: Secondary | ICD-10-CM | POA: Diagnosis present

## 2015-07-19 NOTE — Progress Notes (Signed)
  Patient was seen on 07/16/15 for Gestational Diabetes self-management . The following learning objectives were met by the patient :   States the definition of Gestational Diabetes  States why dietary management is important in controlling blood glucose  Describes the effects of carbohydrates on blood glucose levels  Demonstrates ability to create a balanced meal plan  Demonstrates carbohydrate counting   States when to check blood glucose levels  Demonstrates proper blood glucose monitoring techniques  States the effect of stress and exercise on blood glucose levels  States the importance of limiting caffeine and abstaining from alcohol and smoking  Plan:  Aim for 2 Carb Choices per meal (30 grams) +/- 1 either way for breakfast Aim for 3 Carb Choices per meal (45 grams) +/- 1 either way from lunch and dinner Aim for 1-2 Carbs per snack Begin reading food labels for Total Carbohydrate and sugar grams of foods Consider  increasing your activity level by walking daily as tolerated Begin checking BG before breakfast and 1-2 hours after first bit of breakfast, lunch and dinner after  as directed by MD  Take medication  as directed by MD  Blood glucose monitor given: One Touch Verio Flex Lot # [id number] X Exp: 07/2016 Blood glucose reading: '74mg'$ /dl  Patient instructed to monitor glucose levels: FBS: 60 - <90 1 hour: <140 2 hour: <120  Patient received the following handouts:  Nutrition Diabetes and Pregnancy  Carbohydrate Counting List  Meal Planning worksheet  Patient will be seen for follow-up as needed.

## 2015-08-28 ENCOUNTER — Encounter (HOSPITAL_COMMUNITY): Payer: Self-pay | Admitting: *Deleted

## 2015-08-28 ENCOUNTER — Inpatient Hospital Stay (HOSPITAL_COMMUNITY)
Admission: AD | Admit: 2015-08-28 | Discharge: 2015-08-28 | Disposition: A | Discharge status: Home / Self Care | Payer: 59 | Source: Ambulatory Visit | Attending: Obstetrics and Gynecology | Admitting: Obstetrics and Gynecology

## 2015-08-28 DIAGNOSIS — Z91040 Latex allergy status: Secondary | ICD-10-CM | POA: Insufficient documentation

## 2015-08-28 DIAGNOSIS — N898 Other specified noninflammatory disorders of vagina: Secondary | ICD-10-CM

## 2015-08-28 DIAGNOSIS — R69 Illness, unspecified: Secondary | ICD-10-CM

## 2015-08-28 DIAGNOSIS — O26893 Other specified pregnancy related conditions, third trimester: Secondary | ICD-10-CM | POA: Insufficient documentation

## 2015-08-28 DIAGNOSIS — O9989 Other specified diseases and conditions complicating pregnancy, childbirth and the puerperium: Secondary | ICD-10-CM | POA: Diagnosis not present

## 2015-08-28 DIAGNOSIS — Z3A34 34 weeks gestation of pregnancy: Secondary | ICD-10-CM | POA: Diagnosis not present

## 2015-08-28 DIAGNOSIS — O26819 Pregnancy related exhaustion and fatigue, unspecified trimester: Secondary | ICD-10-CM | POA: Insufficient documentation

## 2015-08-28 DIAGNOSIS — Z87442 Personal history of urinary calculi: Secondary | ICD-10-CM | POA: Diagnosis not present

## 2015-08-28 DIAGNOSIS — R509 Fever, unspecified: Secondary | ICD-10-CM | POA: Diagnosis not present

## 2015-08-28 DIAGNOSIS — R110 Nausea: Secondary | ICD-10-CM | POA: Insufficient documentation

## 2015-08-28 DIAGNOSIS — O24419 Gestational diabetes mellitus in pregnancy, unspecified control: Secondary | ICD-10-CM | POA: Diagnosis not present

## 2015-08-28 DIAGNOSIS — Z888 Allergy status to other drugs, medicaments and biological substances status: Secondary | ICD-10-CM | POA: Insufficient documentation

## 2015-08-28 DIAGNOSIS — J111 Influenza due to unidentified influenza virus with other respiratory manifestations: Secondary | ICD-10-CM | POA: Diagnosis not present

## 2015-08-28 DIAGNOSIS — Z9889 Other specified postprocedural states: Secondary | ICD-10-CM | POA: Diagnosis not present

## 2015-08-28 HISTORY — DX: Gestational diabetes mellitus in pregnancy, unspecified control: O24.419

## 2015-08-28 LAB — URINALYSIS, ROUTINE W REFLEX MICROSCOPIC
BILIRUBIN URINE: NEGATIVE
Glucose, UA: NEGATIVE mg/dL
Ketones, ur: NEGATIVE mg/dL
NITRITE: NEGATIVE
PH: 7 (ref 5.0–8.0)
Protein, ur: NEGATIVE mg/dL
Specific Gravity, Urine: <1.005 — ABNORMAL LOW (ref 1.005–1.030)

## 2015-08-28 LAB — CBC WITH DIFFERENTIAL/PLATELET
BAND NEUTROPHILS: 0 %
BASOS PCT: 0 %
Basophils Absolute: 0 10*3/uL (ref 0.0–0.1)
Blasts: 0 %
Eosinophils Absolute: 0.2 10*3/uL (ref 0.0–0.7)
Eosinophils Relative: 1 %
HCT: 29.6 % — ABNORMAL LOW (ref 36.0–46.0)
Hemoglobin: 10 g/dL — ABNORMAL LOW (ref 12.0–15.0)
LYMPHS ABS: 3 10*3/uL (ref 0.7–4.0)
LYMPHS PCT: 20 %
MCH: 29.1 pg (ref 26.0–34.0)
MCHC: 33.8 g/dL (ref 30.0–36.0)
MCV: 86 fL (ref 78.0–100.0)
MONO ABS: 1.1 10*3/uL — AB (ref 0.1–1.0)
Metamyelocytes Relative: 0 %
Monocytes Relative: 7 %
Myelocytes: 0 %
NEUTROS PCT: 72 %
NRBC: 0 /100{WBCs}
Neutro Abs: 10.7 10*3/uL — ABNORMAL HIGH (ref 1.7–7.7)
OTHER: 0 %
Platelets: 211 10*3/uL (ref 150–400)
Promyelocytes Absolute: 0 %
RBC: 3.44 MIL/uL — ABNORMAL LOW (ref 3.87–5.11)
RDW: 15 % (ref 11.5–15.5)
WBC: 15 10*3/uL — ABNORMAL HIGH (ref 4.0–10.5)

## 2015-08-28 LAB — URINE MICROSCOPIC-ADD ON: RBC / HPF: NONE SEEN RBC/hpf (ref 0–5)

## 2015-08-28 LAB — WET PREP, GENITAL
Clue Cells Wet Prep HPF POC: NONE SEEN
Sperm: NONE SEEN
TRICH WET PREP: NONE SEEN
Yeast Wet Prep HPF POC: NONE SEEN

## 2015-08-28 MED ORDER — ONDANSETRON 4 MG PO TBDP: 4.0000 mg | ORAL_TABLET | Freq: Four times a day (QID) | ORAL | Status: DC | PRN

## 2015-08-28 MED ORDER — OSELTAMIVIR PHOSPHATE 75 MG PO CAPS: 75.0000 mg | ORAL_CAPSULE | Freq: Two times a day (BID) | ORAL | Status: DC

## 2015-08-28 MED ORDER — ACETAMINOPHEN 500 MG PO TABS: 1000.0000 mg | ORAL_TABLET | Freq: Four times a day (QID) | ORAL | Status: AC | PRN

## 2015-08-28 NOTE — MAU Provider Note (Signed)
.Chief Complaint:  Fever  First Provider Initiated Contact with Patient 08/28/15 2132      HPI: Stephanie [name] is a 40 y.o. [id number] at [redacted]w[redacted]d who presents to maternity admissions reporting fever 102.4, body aches and severe fatigue that started today. Had appt at [facility]. Was told that she probably had the flu and was Rx'd Tamiflu. Took one dose today. Took 1000 mg Tylenol at 1830.   Pt received flu vaccine this winter.  Location: All over body Quality: aches, soreness Severity: Moderate Duration: <12 hours Course: worsening Timing: Constant Modifying factors: Slight improvement w/ Tylenol Associated signs and symptoms: Pos for fever, chills, mild"scratchy" throat, mild nausea, increase vaginal discharge and dampness in underwear and pos sick contact (son) who had fever and body aches last week. Neg for vomiting, urinary complaints, cough, congestion, ear pain, or other possible sources of infection.   Mild, rare contractions, neg vaginal bleeding. Good fetal movement.   Past Medical History: Past Medical History  Diagnosis Date  . Environmental allergies   . Kidney stones   . Vaginal delivery 1999, 2002,2006  . Gestational diabetes mellitus (GDM), antepartum   . Gestational diabetes     Past obstetric history: OB History  Gravida Para Term Preterm AB SAB TAB Ectopic Multiple Living  # Outcome Date GA Lbr Len/2nd Weight Sex Delivery Anes PTL Lv  5 Current           4 SAB           3 Gravida      Vag-Spont     2 Gravida      Vag-Spont     1 Gravida      Vag-Spont         Past Surgical History: Past Surgical History  Procedure Laterality Date  . Tonsillectomy    . Sphenoidectomy  11/2012  . Brain tumor excision    . Dilation and evacuation N/A 11/29/2014    Procedure: DILATATION AND EVACUATION;  Surgeon: Lavina Hamman, MD;  Location: [facility];  Service: Gynecology;  Laterality: N/A;     Family History: No family history on file.  Social  History: Social History  Substance Use Topics  . Smoking status: Never Smoker   . Smokeless tobacco: Never Used  . Alcohol Use: Yes     Comment: social    Allergies:  Allergies  Allergen Reactions  . Flonase [Fluticasone Propionate] Other (See Comments)    Made pt pass out  . Latex Itching and Rash    Meds:  Prescriptions prior to admission  Medication Sig Dispense Refill Last Dose  . FOLIC ACID PO Take 1 each by mouth daily.     . IRON, FERROUS GLUCONATE, PO Take 2 tablets by mouth daily.     . polyethylene glycol (MIRALAX / GLYCOLAX) packet Take 17 g by mouth daily. Take 3 doses in a large Gatorade and drink over the course of 1 day. Decrease to once daily for one week when stool is soft. (Patient not taking: Reported on 11/28/2014) 14 each 0 Not Taking at Unknown time  . Prenatal Vit-Fe Fumarate-FA (PRENATAL MULTIVITAMIN) TABS tablet Take 1 tablet by mouth daily at 12 noon.   Taking  . [DISCONTINUED] acetaminophen (TYLENOL) 325 MG tablet Take 325 mg by mouth every 6 (six) hours as needed for mild pain or headache.   11/27/2014 at Unknown time    I have reviewed patient's Past  Medical Hx, Surgical Hx, Family Hx, Social Hx, medications and allergies.   ROS:  Review of Systems  Constitutional: Positive for fever, chills and fatigue.  HENT: Positive for sore throat (mild). Negative for congestion, ear pain, rhinorrhea and sinus pressure.   Respiratory: Negative for cough and shortness of breath.   Gastrointestinal: Positive for nausea and abdominal pain (mild contractions). Negative for vomiting and diarrhea.  Genitourinary: Positive for vaginal discharge. Negative for dysuria, flank pain and vaginal bleeding.  Musculoskeletal: Positive for myalgias. Negative for neck pain and neck stiffness.  Skin: Negative for rash.  Neurological: Positive for weakness. Negative for dizziness and headaches.    Physical Exam   Patient Vitals for the past 24 hrs:  BP Temp Temp src Pulse Resp  SpO2 Height Weight  08/28/15 2231 124/68 mmHg - - 109 - - - -  08/28/15 2227 - - - 110 - 98 % - -  08/28/15 2139 - - - 98 - 98 % - -  08/28/15 2114 - - - 109 - 97 % - -  08/28/15 2109 - - - 106 - 97 % - -  08/28/15 2029 123/69 mmHg 99.2 F (37.3 C) Oral (!) 126 20 -  (1.702 m) 209 lb 2 oz (94.858 kg)   Constitutional: Well-developed, well-nourished female in mild distress. Tired and mildly ill-appearing.  Cardiovascular: tachycardia, improved Respiratory: normal effort. CTAB GI: Abd soft, Mild generalized tenderness, gravid appropriate for gestational age.  MS: No edema, normal ROM. Mild generalized tenderness Neurologic: Alert and oriented x 4.  GU: Neg CVAT.  Pelvic: NEFG, moderate amount of creamy, white, odorless discharge, negative pooling. no blood, cervix clean. No CMT  Dilation: Closed Effacement (%): Thick Exam by:: Adriyana Greenbaum cnm   FHT:  Baseline 145 , moderate variability, accelerations present, no decelerations Contractions: None   Labs: Results for orders placed or performed during the hospital encounter of 08/28/15 (from the past 24 hour(s))  Urinalysis, Routine w reflex microscopic (not at [facility])     Status: Abnormal   Collection Time: 08/28/15  8:33 PM  Result Value Ref Range   Color, Urine YELLOW YELLOW   APPearance CLEAR CLEAR   Specific Gravity, Urine <1.005 (L) 1.005 - 1.030   pH 7.0 5.0 - 8.0   Glucose, UA NEGATIVE NEGATIVE mg/dL   Hgb urine dipstick TRACE (A) NEGATIVE   Bilirubin Urine NEGATIVE NEGATIVE   Ketones, ur NEGATIVE NEGATIVE mg/dL   Protein, ur NEGATIVE NEGATIVE mg/dL   Nitrite NEGATIVE NEGATIVE   Leukocytes, UA SMALL (A) NEGATIVE  Urine microscopic-add on     Status: Abnormal   Collection Time: 08/28/15  8:33 PM  Result Value Ref Range   Squamous Epithelial / LPF 0-5 (A) NONE SEEN   WBC, UA 0-5 0 - 5 WBC/hpf   RBC / HPF NONE SEEN 0 - 5 RBC/hpf   Bacteria, UA RARE (A) NONE SEEN  CBC with Differential/Platelet     Status: Abnormal    Collection Time: 08/28/15  8:58 PM  Result Value Ref Range   WBC 15.0 (H) 4.0 - 10.5 K/uL   RBC 3.44 (L) 3.87 - 5.11 MIL/uL   Hemoglobin 10.0 (L) 12.0 - 15.0 g/dL   HCT 16.1 (L) 09.6 - 04.5 %   MCV 86.0 78.0 - 100.0 fL   MCH 29.1 26.0 - 34.0 pg   MCHC 33.8 30.0 - 36.0 g/dL   RDW 40.9 81.1 - 91.4 %   Platelets 211 150 - 400 K/uL   Neutrophils  Relative % 72 %   Lymphocytes Relative 20 %   Monocytes Relative 7 %   Eosinophils Relative 1 %   Basophils Relative 0 %   Band Neutrophils 0 %   Metamyelocytes Relative 0 %   Myelocytes 0 %   Promyelocytes Absolute 0 %   Blasts 0 %   nRBC 0 0 /100 WBC   Other 0 %   Neutro Abs 10.7 (H) 1.7 - 7.7 K/uL   Lymphs Abs 3.0 0.7 - 4.0 K/uL   Monocytes Absolute 1.1 (H) 0.1 - 1.0 K/uL   Eosinophils Absolute 0.2 0.0 - 0.7 K/uL   Basophils Absolute 0.0 0.0 - 0.1 K/uL  Wet prep, genital     Status: Abnormal   Collection Time: 08/28/15 10:08 PM  Result Value Ref Range   Yeast Wet Prep HPF POC NONE SEEN NONE SEEN   Trich, Wet Prep NONE SEEN NONE SEEN   Clue Cells Wet Prep HPF POC NONE SEEN NONE SEEN   WBC, Wet Prep HPF POC MODERATE (A) NONE SEEN   Sperm NONE SEEN     Imaging:  No results found.  MAU Course: CBC with differential, flu PCR, UA, sterile spec exam, firm, wet prep.  Discussed history, exam, labs, fetal heart rate tracing with Dr. Jackelyn Knife. No indication for admission no other testing at this time. May discharge patient and structure to complete course of Tamiflu.  MDM: - 40 year old female at 37 weeks 6 days gestation with influenza-like illness. Fever controlled with Tylenol. Mild leukocytosis, but low suspicion for pneumonia or sepsis based on exam.  Assessment: 1. Influenza-like illness   2. Vaginal discharge during pregnancy in third trimester     Plan: Discharge home in stable condition per consult with Dr Jackelyn Knife.  Comfort measures.  Tylenol round-the-clock for 48-72 hours Complete course of Tamiflu 75 mg twice a  day 5 days. Return to maternity admissions for difficulty breathing. Fever not controlled with Tylenol or if unable to keep anything down for greater than 24 hours. Preterm labor precautions and fetal kick counts Rx Zofran     Follow-up Information    Follow up with [facility] In 1 week.   Why:  Start prenatal care   Contact information:   [address]  [address] [place] [address] [phone]       Follow up with [facility].   Why:  As needed if symptoms worsen (fever uncontrolled with Tylenol, unable to keep down food or fluids greater then 24 hours, difficulty breathing)   Contact information:   [address] [id number] mc [place] [address] [phone]        Medication List    STOP taking these medications        polyethylene glycol packet  Commonly known as:  MIRALAX / GLYCOLAX      TAKE these medications        acetaminophen 500 MG tablet  Commonly known as:  TYLENOL  Take 2 tablets (1,000 mg total) by mouth every 6 (six) hours as needed for moderate pain, fever or headache.     FOLIC ACID PO  Take 1 each by mouth daily.     IRON (FERROUS GLUCONATE) PO  Take 2 tablets by mouth daily.     ondansetron 4 MG disintegrating tablet  Commonly known as:  ZOFRAN ODT  Take 1 tablet (4 mg total) by mouth every 6 (six) hours as needed for nausea.  oseltamivir 75 MG capsule  Commonly known as:  TAMIFLU  Take 1 capsule (75 mg total) by mouth 2 (two) times daily.     prenatal multivitamin Tabs tablet  Take 1 tablet by mouth daily at 12 noon.        [place] Elija M[place], [place] 08/28/2015 10:37 PM

## 2015-08-28 NOTE — MAU Note (Addendum)
PT  SAYS SHE HAD FEVER AT HOME-  102.4      NO VOM ITING-  FEELS  NAUSEA,     NO DIARRHEA,      SLIGHT  COUGH,    THROAT SORE.-   ALL STARTED  THIS  AM          HAS BEEN  DRINKING  FLUIDS.    CALL DR Jackelyn KnifeMEISINGER.      TOOK 2 XS   TYLENOL  AT 630PM.     WAS IN OFFICE  TODAY-   HAS HAD 1 DOSE  OF  TAMIFLU  TODAY

## 2015-08-28 NOTE — Discharge Instructions (Signed)
Pregnancy and Influenza °Influenza, also called the flu, is an infection of the respiratory tract. If you are pregnant, you are more likely to catch the flu. You are also more likely to have a more serious case of the flu. This is because pregnancy lowers your body's ability to fight off infections (it weakens your immune system). It also puts additional stress on your heart and lungs, which makes you more likely to have complications. Having a bad case of the flu, especially with a high fever, can be dangerous for your developing baby. It can cause you to go into early labor. °HOW DO PEOPLE GET THE FLU? °The flu is caused by the influenza virus. This virus is common every year in the fall and winter. It spreads when virus particles get passed from person to person. You can get the virus if you are near a sick person who is coughing or sneezing. You can also get the virus if you touch something that has the virus on it and then touch your face. °HOW CAN I PROTECT MYSELF AGAINST THE FLU? °· Get a flu shot. The best way to prevent the flu is to get a flu shot before flu season starts. The flu shot is not dangerous for your developing baby. It may even help protect your baby from the flu for up to 6 months after birth. The flu shot is one type of flu vaccine. Another type is a nasal spray vaccine. Do not get the nasal spray vaccine. It is not approved for pregnancy. °· Do not come in close contact with sick people. °· Do not share food, drinks, or utensils with other people. °· Wash your hands often. Use hand sanitizer when soap and water are not available. °WHAT SHOULD I DO IF I HAVE FLU SYMPTOMS? °If you have any flu symptoms, call your health care provider right away. Flu symptoms include: °· Fever or chills. °· Muscle aches. °· Headache. °· Sore throat. °· Nasal congestion. °· Cough. °· Feeling tired. °· Loss of appetite. °· Vomiting. °· Diarrhea. °You may be able to take an antiviral medicine to keep the flu from  becoming severe and to shorten how long it lasts. °WHAT SHOULD I DO AT HOME IF I AM DIAGNOSED WITH THE FLU? °· Do not take any medicine, including cold or flu medicine, unless directed by your health care provider. °· If you take antiviral medicine, make sure you finish it even if you start to feel better. °· Drink enough fluid to keep your urine clear or pale yellow. °· Get plenty of rest. °WHEN WOULD I SEEK IMMEDIATE MEDICAL CARE IF I HAVE THE FLU? °· You have trouble breathing. °· You have chest pain. °· You begin to have labor pains. °· You have a high fever that does not go down after you take medicine. °· You do not feel your baby move. °· You have diarrhea or vomiting that will not go away. °  °This information is not intended to replace advice given to you by your health care provider. Make sure you discuss any questions you have with your health care provider. °  °Document Released: 03/12/2008 Document Revised: 05/15/2013 Document Reviewed: 04/06/2013 °Elsevier Interactive Patient Education ©2016 [employer] ° °

## 2015-08-29 LAB — INFLUENZA PANEL BY PCR (TYPE A & B)
H1N1 flu by pcr: NOT DETECTED
INFLAPCR: NEGATIVE
INFLBPCR: NEGATIVE

## 2015-09-17 LAB — OB RESULTS CONSOLE GBS: GBS: NEGATIVE

## 2015-09-21 ENCOUNTER — Encounter (HOSPITAL_COMMUNITY): Payer: Self-pay | Admitting: Certified Nurse Midwife

## 2015-09-21 ENCOUNTER — Inpatient Hospital Stay (HOSPITAL_COMMUNITY)
Admission: AD | Admit: 2015-09-21 | Discharge: 2015-09-21 | Disposition: A | Discharge status: Home / Self Care | Payer: 59 | Source: Ambulatory Visit | Attending: Obstetrics and Gynecology | Admitting: Obstetrics and Gynecology

## 2015-09-21 DIAGNOSIS — Z3493 Encounter for supervision of normal pregnancy, unspecified, third trimester: Secondary | ICD-10-CM | POA: Diagnosis not present

## 2015-09-21 LAB — GLUCOSE, CAPILLARY: GLUCOSE-CAPILLARY: 56 mg/dL — AB (ref 65–99)

## 2015-09-21 MED ORDER — MORPHINE SULFATE (PF) 10 MG/ML IV SOLN: 10.0000 mg | Freq: Once | INTRAVENOUS | Status: DC

## 2015-09-21 NOTE — Discharge Instructions (Signed)
Fetal Movement Counts °Patient Name: __________________________________________________ Patient Due Date: ____________________ °Performing a fetal movement count is highly recommended in high-risk pregnancies, but it is good for every pregnant woman to do. Your health care provider may ask you to start counting fetal movements at 28 weeks of the pregnancy. Fetal movements often increase: °· After eating a full meal. °· After physical activity. °· After eating or drinking something sweet or cold. °· At rest. °Pay attention to when you feel the baby is most active. This will help you notice a pattern of your baby's sleep and wake cycles and what factors contribute to an increase in fetal movement. It is important to perform a fetal movement count at the same time each day when your baby is normally most active.  °HOW TO COUNT FETAL MOVEMENTS °1. Find a quiet and comfortable area to sit or lie down on your left side. Lying on your left side provides the best blood and oxygen circulation to your baby. °2. Write down the day and time on a sheet of paper or in a journal. °3. Start counting kicks, flutters, swishes, rolls, or jabs in a 2-hour period. You should feel at least 10 movements within 2 hours. °4. If you do not feel 10 movements in 2 hours, wait 2-3 hours and count again. Look for a change in the pattern or not enough counts in 2 hours. °SEEK MEDICAL CARE IF: °· You feel less than 10 counts in 2 hours, tried twice. °· There is no movement in over an hour. °· The pattern is changing or taking longer each day to reach 10 counts in 2 hours. °· You feel the baby is not moving as he or she usually does. °Date: ____________ Movements: ____________ Start time: ____________ Finish time: ____________  °Date: ____________ Movements: ____________ Start time: ____________ Finish time: ____________ °Date: ____________ Movements: ____________ Start time: ____________ Finish time: ____________ °Date: ____________ Movements:  ____________ Start time: ____________ Finish time: ____________ °Date: ____________ Movements: ____________ Start time: ____________ Finish time: ____________ °Date: ____________ Movements: ____________ Start time: ____________ Finish time: ____________ °Date: ____________ Movements: ____________ Start time: ____________ Finish time: ____________ °Date: ____________ Movements: ____________ Start time: ____________ Finish time: ____________  °Date: ____________ Movements: ____________ Start time: ____________ Finish time: ____________ °Date: ____________ Movements: ____________ Start time: ____________ Finish time: ____________ °Date: ____________ Movements: ____________ Start time: ____________ Finish time: ____________ °Date: ____________ Movements: ____________ Start time: ____________ Finish time: ____________ °Date: ____________ Movements: ____________ Start time: ____________ Finish time: ____________ °Date: ____________ Movements: ____________ Start time: ____________ Finish time: ____________ °Date: ____________ Movements: ____________ Start time: ____________ Finish time: ____________  °Date: ____________ Movements: ____________ Start time: ____________ Finish time: ____________ °Date: ____________ Movements: ____________ Start time: ____________ Finish time: ____________ °Date: ____________ Movements: ____________ Start time: ____________ Finish time: ____________ °Date: ____________ Movements: ____________ Start time: ____________ Finish time: ____________ °Date: ____________ Movements: ____________ Start time: ____________ Finish time: ____________ °Date: ____________ Movements: ____________ Start time: ____________ Finish time: ____________ °Date: ____________ Movements: ____________ Start time: ____________ Finish time: ____________  °Date: ____________ Movements: ____________ Start time: ____________ Finish time: ____________ °Date: ____________ Movements: ____________ Start time: ____________ Finish  time: ____________ °Date: ____________ Movements: ____________ Start time: ____________ Finish time: ____________ °Date: ____________ Movements: ____________ Start time: ____________ Finish time: ____________ °Date: ____________ Movements: ____________ Start time: ____________ Finish time: ____________ °Date: ____________ Movements: ____________ Start time: ____________ Finish time: ____________ °Date: ____________ Movements: ____________ Start time: ____________ Finish time: ____________  °Date: ____________ Movements: ____________ Start time: ____________ Finish   time: ____________ °Date: ____________ Movements: ____________ Start time: ____________ Finish time: ____________ °Date: ____________ Movements: ____________ Start time: ____________ Finish time: ____________ °Date: ____________ Movements: ____________ Start time: ____________ Finish time: ____________ °Date: ____________ Movements: ____________ Start time: ____________ Finish time: ____________ °Date: ____________ Movements: ____________ Start time: ____________ Finish time: ____________ °Date: ____________ Movements: ____________ Start time: ____________ Finish time: ____________  °Date: ____________ Movements: ____________ Start time: ____________ Finish time: ____________ °Date: ____________ Movements: ____________ Start time: ____________ Finish time: ____________ °Date: ____________ Movements: ____________ Start time: ____________ Finish time: ____________ °Date: ____________ Movements: ____________ Start time: ____________ Finish time: ____________ °Date: ____________ Movements: ____________ Start time: ____________ Finish time: ____________ °Date: ____________ Movements: ____________ Start time: ____________ Finish time: ____________ °Date: ____________ Movements: ____________ Start time: ____________ Finish time: ____________  °Date: ____________ Movements: ____________ Start time: ____________ Finish time: ____________ °Date: ____________  Movements: ____________ Start time: ____________ Finish time: ____________ °Date: ____________ Movements: ____________ Start time: ____________ Finish time: ____________ °Date: ____________ Movements: ____________ Start time: ____________ Finish time: ____________ °Date: ____________ Movements: ____________ Start time: ____________ Finish time: ____________ °Date: ____________ Movements: ____________ Start time: ____________ Finish time: ____________ °Date: ____________ Movements: ____________ Start time: ____________ Finish time: ____________  °Date: ____________ Movements: ____________ Start time: ____________ Finish time: ____________ °Date: ____________ Movements: ____________ Start time: ____________ Finish time: ____________ °Date: ____________ Movements: ____________ Start time: ____________ Finish time: ____________ °Date: ____________ Movements: ____________ Start time: ____________ Finish time: ____________ °Date: ____________ Movements: ____________ Start time: ____________ Finish time: ____________ °Date: ____________ Movements: ____________ Start time: ____________ Finish time: ____________ °  °This information is not intended to replace advice given to you by your health care provider. Make sure you discuss any questions you have with your health care provider. °  °Document Released: 06/09/2006 Document Revised: 05/31/2014 Document Reviewed: 03/06/2012 °Elsevier Interactive Patient Education ©2016 [employer] °Vaginal Delivery °During delivery, your health care provider will help you give birth to your baby. During a vaginal delivery, you will work to push the baby out of your vagina. However, before you can push your baby out, a few things need to happen. The opening of your uterus (cervix) has to soften, thin out, and open up (dilate) all the way to 10 cm. Also, your baby has to move down from the uterus into your vagina.  °SIGNS OF LABOR  °Your health care provider will first need to make sure you  are in labor. Signs of labor include:  °· Passing what is called the mucous plug before labor begins. This is a small amount of blood-stained mucus. °· Having regular, painful uterine contractions.   °· The time between contractions gets shorter.   °· The discomfort and pain gradually get more intense. °· Contraction pains get worse when walking and do not go away when resting.   °· Your cervix becomes thinner (effacement) and dilates. °BEFORE THE DELIVERY °Once you are in labor and admitted into the hospital or care center, your health care provider may do the following:  °5. Perform a complete physical exam. °6. Review any complications related to pregnancy or labor.  °7. Check your blood pressure, pulse, temperature, and heart rate (vital signs).   °8. Determine if, and when, the rupture of amniotic membranes occurred. °9. Do a vaginal exam (using a sterile glove and lubricant) to determine:   °1. The position (presentation) of the baby. Is the baby's head presenting first (vertex) in the birth canal (vagina), or are the feet or buttocks first (breech)?   °2. The level (station) of the baby's head within   the birth canal.   °3. The effacement and dilatation of the cervix.   °10. An electronic fetal monitor is usually placed on your abdomen when you first arrive. This is used to monitor your contractions and the baby's heart rate. °1. When the monitor is on your abdomen (external fetal monitor), it can only pick up the frequency and length of your contractions. It cannot tell the strength of your contractions. °2. If it becomes necessary for your health care provider to know exactly how strong your contractions are or to see exactly what the baby's heart rate is doing, an internal monitor may be inserted into your vagina and uterus. Your health care provider will discuss the benefits and risks of using an internal monitor and obtain your permission before inserting the device. °3. Continuous fetal monitoring may be  needed if you have an epidural, are receiving certain medicines (such as oxytocin), or have pregnancy or labor complications. °11. An IV access tube may be placed into a vein in your arm to deliver fluids and medicines if necessary. °THREE STAGES OF LABOR AND DELIVERY °Normal labor and delivery is divided into three stages. °First Stage °This stage starts when you begin to contract regularly and your cervix begins to efface and dilate. It ends when your cervix is completely open (fully dilated). The first stage is the longest stage of labor and can last from 3 hours to 15 hours.  °Several methods are available to help with labor pain. You and your health care provider will decide which option is best for you. Options include:  °· Opioid medicines. These are strong pain medicines that you can get through your IV tube or as a shot into your muscle. These medicines lessen pain but do not make it go away completely.  °· Epidural. A medicine is given through a thin tube that is inserted in your back. The medicine numbs the lower part of your body and prevents any pain in that area. °· Paracervical pain medicine. This is an injection of an anesthetic on each side of your cervix.   °· You may request natural childbirth, which does not involve the use of pain medicines or an epidural during labor and delivery. Instead, you will use other things, such as breathing exercises, to help cope with the pain. °Second Stage °The second stage of labor begins when your cervix is fully dilated at 10 cm. It continues until you push your baby down through the birth canal and the baby is born. This stage can take only minutes or several hours. °· The location of your baby's head as it moves through the birth canal is reported as a number called a station. If the baby's head has not started its descent, the station is described as being at minus 3 (-3). When your baby's head is at the zero station, it is at the middle of the birth canal  and is engaged in the pelvis. The station of your baby helps indicate the progress of the second stage of labor. °· When your baby is born, your health care provider may hold the baby with his or her head lowered to prevent amniotic fluid, mucus, and blood from getting into the baby's lungs. The baby's mouth and nose may be suctioned with a small bulb syringe to remove any additional fluid. °· Your health care provider may then place the baby on your stomach. It is important to keep the baby from getting cold. To do this, the health care provider will dry   the baby off, place the baby directly on your skin (with no blankets between you and the baby), and cover the baby with warm, dry blankets.   °· The umbilical cord is cut. °Third Stage °During the third stage of labor, your health care provider will deliver the placenta (afterbirth) and make sure your bleeding is under control. The delivery of the placenta usually takes about 5 minutes but can take up to 30 minutes. After the placenta is delivered, a medicine may be given either by IV or injection to help contract the uterus and control bleeding. If you are planning to breastfeed, you can try to do so now. °After you deliver the placenta, your uterus should contract and get very firm. If your uterus does not remain firm, your health care provider will massage it. This is important because the contraction of the uterus helps cut off bleeding at the site where the placenta was attached to your uterus. If your uterus does not contract properly and stay firm, you may continue to bleed heavily. If there is a lot of bleeding, medicines may be given to contract the uterus and stop the bleeding.  °  °This information is not intended to replace advice given to you by your health care provider. Make sure you discuss any questions you have with your health care provider. °  °Document Released: 02/17/2008 Document Revised: 05/31/2014 Document Reviewed: 01/05/2012 °Elsevier  Interactive Patient Education ©2016 [employer] Inc. ° °

## 2015-09-21 NOTE — MAU Note (Deleted)
Pain in lower abd, and to left side.  Started on Friday, comes and goes. Pain when she pees, started today, frequency and urgency. Nausea and vomiting (mother reports she is not eating, and when she vomits- it looks like coffee grounds) since Friday.  No diarrhea, has been constipated.

## 2015-09-21 NOTE — MAU Note (Signed)
Pt made aware that Dr. Jackelyn KnifeMeisinger [facility]. Pt refuses to have have labs drawn and states she just wants to go home and eat. RN explains the risks associated with PIH and pt states "I don't have preeclampsia". Pt given the option to stay for 2 hours and have SVE rechecked and pt refuses.

## 2015-09-21 NOTE — MAU Note (Signed)
Pt states she is in a lot of pain 5/10. Pt states she began ctxs last night that have gotten worse. Pt denies LOF or vaginal bleeding. Fetus active but less today.

## 2015-09-26 ENCOUNTER — Inpatient Hospital Stay (HOSPITAL_COMMUNITY)
Admission: AD | Admit: 2015-09-26 | Discharge: 2015-09-30 | DRG: 766 | Disposition: A | Discharge status: Home / Self Care | Payer: 59 | Source: Ambulatory Visit | Attending: Obstetrics and Gynecology | Admitting: Obstetrics and Gynecology

## 2015-09-26 ENCOUNTER — Encounter (HOSPITAL_COMMUNITY): Payer: Self-pay | Admitting: *Deleted

## 2015-09-26 DIAGNOSIS — O24415 Gestational diabetes mellitus in pregnancy, controlled by oral hypoglycemic drugs: Principal | ICD-10-CM | POA: Diagnosis present

## 2015-09-26 DIAGNOSIS — Z3A39 39 weeks gestation of pregnancy: Secondary | ICD-10-CM

## 2015-09-26 DIAGNOSIS — D649 Anemia, unspecified: Secondary | ICD-10-CM | POA: Diagnosis present

## 2015-09-26 DIAGNOSIS — O9902 Anemia complicating childbirth: Secondary | ICD-10-CM | POA: Diagnosis present

## 2015-09-26 DIAGNOSIS — Z98891 History of uterine scar from previous surgery: Secondary | ICD-10-CM

## 2015-09-26 DIAGNOSIS — O24419 Gestational diabetes mellitus in pregnancy, unspecified control: Secondary | ICD-10-CM

## 2015-09-26 LAB — CBC
HEMATOCRIT: 32.6 % — AB (ref 36.0–46.0)
HEMOGLOBIN: 10.9 g/dL — AB (ref 12.0–15.0)
MCH: 27.7 pg (ref 26.0–34.0)
MCHC: 33.4 g/dL (ref 30.0–36.0)
MCV: 83 fL (ref 78.0–100.0)
Platelets: 204 10*3/uL (ref 150–400)
RBC: 3.93 MIL/uL (ref 3.87–5.11)
RDW: 14.3 % (ref 11.5–15.5)
WBC: 11.3 10*3/uL — ABNORMAL HIGH (ref 4.0–10.5)

## 2015-09-26 LAB — BASIC METABOLIC PANEL
ANION GAP: 11 (ref 5–15)
BUN: <5 mg/dL — ABNORMAL LOW (ref 6–20)
CHLORIDE: 109 mmol/L (ref 101–111)
CO2: 18 mmol/L — AB (ref 22–32)
Calcium: 9.3 mg/dL (ref 8.9–10.3)
Creatinine, Ser: 0.51 mg/dL (ref 0.44–1.00)
GFR calc non Af Amer: >60 mL/min (ref >60)
GLUCOSE: 70 mg/dL (ref 65–99)
Potassium: 3.4 mmol/L — ABNORMAL LOW (ref 3.5–5.1)
Sodium: 138 mmol/L (ref 135–145)

## 2015-09-26 LAB — TYPE AND SCREEN
ABO/RH(D): O POS
ANTIBODY SCREEN: NEGATIVE

## 2015-09-26 LAB — ABO/RH: ABO/RH(D): O POS

## 2015-09-26 LAB — GLUCOSE, CAPILLARY: Glucose-Capillary: 138 mg/dL — ABNORMAL HIGH (ref 65–99)

## 2015-09-26 MED ORDER — TERBUTALINE SULFATE 1 MG/ML IJ SOLN: 0.2500 mg | Freq: Once | INTRAMUSCULAR | Status: DC | PRN

## 2015-09-26 MED ORDER — LIDOCAINE HCL (PF) 1 % IJ SOLN
30.0000 mL | INTRAMUSCULAR | Status: DC | PRN
  Filled 2015-09-26: qty 30

## 2015-09-26 MED ORDER — OXYTOCIN 10 UNIT/ML IJ SOLN: 2.5000 [IU]/h | INTRAMUSCULAR | Status: DC

## 2015-09-26 MED ORDER — OXYCODONE-ACETAMINOPHEN 5-325 MG PO TABS: 2.0000 | ORAL_TABLET | ORAL | Status: DC | PRN

## 2015-09-26 MED ORDER — OXYCODONE-ACETAMINOPHEN 5-325 MG PO TABS: 1.0000 | ORAL_TABLET | ORAL | Status: DC | PRN

## 2015-09-26 MED ORDER — LACTATED RINGERS IV SOLN
INTRAVENOUS | Status: DC
  Administered 2015-09-26 (×2): 125 mL/h via INTRAVENOUS

## 2015-09-26 MED ORDER — OXYTOCIN BOLUS FROM INFUSION
500.0000 mL | INTRAVENOUS | Status: DC
Start: 2015-09-26 — End: 2015-09-30

## 2015-09-26 MED ORDER — OXYTOCIN 10 UNIT/ML IJ SOLN
1.0000 m[IU]/min | INTRAVENOUS | Status: DC
  Administered 2015-09-26: 1 m[IU]/min via INTRAVENOUS
  Filled 2015-09-26: qty 4

## 2015-09-26 MED ORDER — MISOPROSTOL 25 MCG QUARTER TABLET
25.0000 ug | ORAL_TABLET | ORAL | Status: AC
  Administered 2015-09-26 – 2015-09-27 (×2): 25 ug via VAGINAL
  Filled 2015-09-26 (×2): qty 0.25

## 2015-09-26 MED ORDER — ACETAMINOPHEN 325 MG PO TABS: 650.0000 mg | ORAL_TABLET | ORAL | Status: DC | PRN

## 2015-09-26 MED ORDER — CITRIC ACID-SODIUM CITRATE 334-500 MG/5ML PO SOLN
30.0000 mL | ORAL | Status: DC | PRN
  Administered 2015-09-27: 30 mL via ORAL
  Filled 2015-09-26: qty 15

## 2015-09-26 MED ORDER — LACTATED RINGERS IV SOLN: 500.0000 mL | INTRAVENOUS | Status: DC | PRN

## 2015-09-26 MED ORDER — ONDANSETRON HCL 4 MG/2ML IJ SOLN
4.0000 mg | Freq: Four times a day (QID) | INTRAMUSCULAR | Status: DC | PRN
  Administered 2015-09-27: 4 mg via INTRAVENOUS
  Filled 2015-09-26: qty 2

## 2015-09-26 NOTE — Progress Notes (Signed)
Patient ID: Stephanie HughsRebecca G [name], female   DOB: [birth year], 40 y.o.   MRN: [id number] Was 3-4cm dilated per nurse about 30mins ago. Doing well.  Continue pitocin per protocol Will AROM if able

## 2015-09-26 NOTE — Progress Notes (Addendum)
Stephanie [name] is a 40 y.o. [id number] at 2458w0d by ultrasound admitted for induction of labor due to Gestational diabetes and LGA.  Subjective:   Objective: BP 111/70 mmHg  Pulse 126  Temp(Src) 98.1 F (36.7 C) (Oral)  Resp 20  Ht 5\' 10"  (1.778 m)  Wt 210 lb (95.255 kg)  BMI 30.13 kg/m2  LMP 12/23/2014 (Approximate)      FHT:  FHR: 135 bpm, variability: moderate,  accelerations:  Present,  decelerations:  Absent UC:   regular, every 5 minutes SVE:   Dilation: 3.5 Effacement (%): 60 Station: -2 Exam by:: Valentina Lucks. Woods, RN  Labs: Lab Results  Component Value Date   WBC 11.3* 09/26/2015   HGB 10.9* 09/26/2015   HCT 32.6* 09/26/2015   MCV 83.0 09/26/2015   PLT 204 09/26/2015    Assessment / Plan: IOL - on pitocin ; latent to active labor; continue on pitocin  3.5/40/-3 per my exam  Labor: Progressing on Pitocin, will continue to increase then AROM when fetal station lower Preeclampsia:  n/a Fetal Wellbeing:  Category I Pain Control:  Labor support without medications I/D:  n/a Anticipated MOD:  NSVD  Stephanie [name] 09/26/2015, 5:35 PM

## 2015-09-26 NOTE — Anesthesia Pain Management Evaluation Note (Signed)
  [facility] Pain Management Visit Note  Patient: Stephanie HughsRebecca G [name], 40 y.o., female  "Hello [facility] am a [facility]. We have an anesthesia team available at all times to provide care throughout the hospital, including epidural management and anesthesia for C-section. I don't know your plan for the delivery whether it a natural birth, water birth, IV sedation, nitrous supplementation, doula or epidural, but we want to meet your pain goals."   1.Was your pain managed to your expectations on prior hospitalizations? N/A   2.What is your expectation for pain management during this hospitalization?     Labor support without medications  3.How can we help you reach that goal? Support  Record the patient's initial score and the patient's pain goal.   Pain: 2  Pain Goal: 10 [facility] wants you to be able to say your pain was always managed very well.  [facility],Stephanie [name] 09/26/2015

## 2015-09-26 NOTE — Progress Notes (Signed)
Patient ID: Stephanie HughsRebecca G [name], female   DOB: [birth year], 40 y.o.   MRN: [id number] Pitocin started. Pt with no complaints. Requests wait for AROM. Will recheck in 2hours; pitocin per protocol

## 2015-09-26 NOTE — H&P (Signed)
Stephanie [name] is a 40 y.o. female presenting for iol. Pt is  GDMA2 at 39 weeks. She has a history of LGA babies and is measuring slightly LGA again this time. Her dating is based on a 15 week us. Pt has had increasing discomfort due to pelvic pressure and body aches since [redacted] weeks gestation. Her blood sugars have been moderately controlled on glyburide. Recently reports increased body aches and unrelenting pelvic pressure. Has reassuring bishops score. R/b of iol have been reviewed  Maternal Medical History:  Reason for admission: Contractions.  Nausea.  Contractions: Onset was more than 2 days ago.   Frequency: irregular.   Perceived severity is moderate.    Fetal activity: Perceived fetal activity is normal.   Last perceived fetal movement was within the past hour.    Prenatal complications: no prenatal complications Prenatal Complications - Diabetes: gestational. Diabetes is managed by oral agent (monotherapy).      OB History    Gravida Para Term Preterm AB TAB SAB Ectopic Multiple Living   5    1  1   3      Past Medical History  Diagnosis Date  . Environmental allergies   . Kidney stones   . Vaginal delivery 1999, 2002,2006  . Gestational diabetes mellitus (GDM), antepartum   . Gestational diabetes    Past Surgical History  Procedure Laterality Date  . Tonsillectomy    . Sphenoidectomy  11/2012  . Brain tumor excision    . Dilation and evacuation N/A 11/29/2014    Procedure: DILATATION AND EVACUATION;  Surgeon: Lavina Hammanodd Meisinger, MD;  Location: [facility];  Service: Gynecology;  Laterality: N/A;   Family History: family history is not on file. Social History:  reports that she has never smoked. She has never used smokeless tobacco. She reports that she drinks alcohol. She reports that she does not use illicit drugs.   Prenatal Transfer Tool  Maternal Diabetes: Yes:  Diabetes Type:  Insulin/Medication controlled Genetic Screening: Declined Maternal Ultrasounds/Referrals:  Normal Fetal Ultrasounds or other Referrals:  None Maternal Substance Abuse:  No Significant Maternal Medications:  None Significant Maternal Lab Results:  Lab values include: Group B Strep negative Other Comments:  None  Review of Systems  Constitutional: Positive for malaise/fatigue. Negative for fever, chills and weight loss.  Eyes: Negative for blurred vision and double vision.  Respiratory: Negative for shortness of breath.   Cardiovascular: Negative for chest pain.  Gastrointestinal: Positive for abdominal pain. Negative for heartburn, nausea and vomiting.  Musculoskeletal: Positive for myalgias and back pain.  Skin: Negative for itching and rash.  Neurological: Negative for dizziness and headaches.  Psychiatric/Behavioral: Negative for depression, suicidal ideas and substance abuse. The patient is nervous/anxious.       Blood pressure 126/86, pulse 98, temperature 98.3 F (36.8 C), temperature source Oral, resp. rate 20, height 5\' 10"  (1.778 m), weight 210 lb (95.255 kg), last menstrual period 12/23/2014. Maternal Exam:  Uterine Assessment: Contraction strength is moderate.  Contraction frequency is irregular.   Abdomen: Patient reports generalized tenderness.  Estimated fetal weight is lga.   Fetal presentation: vertex  Introitus: Normal vulva. Normal vagina.  Pelvis: adequate for delivery.   Cervix: Cervix evaluated by digital exam.     Physical Exam  Constitutional: She is oriented to person, place, and time. She appears well-developed and well-nourished.  Neck: Normal range of motion.  Cardiovascular: Normal rate.   Respiratory: Effort normal.  GI: Soft. There is generalized tenderness.  Genitourinary: Vagina normal and  uterus normal.  Musculoskeletal: Normal range of motion. She exhibits edema and tenderness.  Neurological: She is alert and oriented to person, place, and time.  Skin: Skin is warm.  Psychiatric: She has a normal mood and affect. Her behavior is  normal. Judgment and thought content normal.    Prenatal labs: ABO, Rh: O/Positive/-- (11/21 0000) Antibody: Negative (11/21 0000) Rubella: Immune (11/21 0000) RPR: Nonreactive (11/21 0000)  HBsAg: Negative (11/21 0000)  HIV: Non-reactive (11/21 0000)  GBS: Negative (04/26 0000)   Assessment/Plan: [id number] at 39 weeks for iol due to GDMA2- stable  Will AROM if able or start on pitocin Pain control prn Anticipate svd   Stephanie [name] 09/26/2015, 11:03 AM

## 2015-09-27 ENCOUNTER — Inpatient Hospital Stay (HOSPITAL_COMMUNITY): Payer: 59 | Admitting: Anesthesiology

## 2015-09-27 ENCOUNTER — Encounter (HOSPITAL_COMMUNITY): Payer: Self-pay | Admitting: *Deleted

## 2015-09-27 ENCOUNTER — Encounter (HOSPITAL_COMMUNITY)
Admission: AD | Disposition: A | Discharge status: Home / Self Care | Payer: Self-pay | Source: Ambulatory Visit | Attending: Obstetrics and Gynecology

## 2015-09-27 DIAGNOSIS — Z98891 History of uterine scar from previous surgery: Secondary | ICD-10-CM

## 2015-09-27 LAB — GLUCOSE, CAPILLARY
GLUCOSE-CAPILLARY: 125 mg/dL — AB (ref 65–99)
GLUCOSE-CAPILLARY: 67 mg/dL (ref 65–99)
GLUCOSE-CAPILLARY: 77 mg/dL (ref 65–99)
Glucose-Capillary: 118 mg/dL — ABNORMAL HIGH (ref 65–99)

## 2015-09-27 LAB — RPR: RPR: NONREACTIVE

## 2015-09-27 SURGERY — Surgical Case
Anesthesia: General

## 2015-09-27 MED ORDER — ZOLPIDEM TARTRATE 5 MG PO TABS: 5.0000 mg | ORAL_TABLET | Freq: Every evening | ORAL | Status: DC | PRN

## 2015-09-27 MED ORDER — FENTANYL CITRATE (PF) 100 MCG/2ML IJ SOLN
25.0000 ug | INTRAMUSCULAR | Status: DC | PRN
  Administered 2015-09-27 (×3): 25 ug via INTRAVENOUS

## 2015-09-27 MED ORDER — PHENYLEPHRINE 40 MCG/ML (10ML) SYRINGE FOR IV PUSH (FOR BLOOD PRESSURE SUPPORT)
80.0000 ug | PREFILLED_SYRINGE | INTRAVENOUS | Status: DC | PRN
  Filled 2015-09-27: qty 10

## 2015-09-27 MED ORDER — MAGNESIUM HYDROXIDE 400 MG/5ML PO SUSP: 30.0000 mL | ORAL | Status: DC | PRN

## 2015-09-27 MED ORDER — FAMOTIDINE IN NACL 20-0.9 MG/50ML-% IV SOLN
20.0000 mg | Freq: Once | INTRAVENOUS | Status: DC
  Filled 2015-09-27: qty 50

## 2015-09-27 MED ORDER — DEXAMETHASONE SODIUM PHOSPHATE 4 MG/ML IJ SOLN
INTRAMUSCULAR | Status: AC
  Filled 2015-09-27: qty 1

## 2015-09-27 MED ORDER — SODIUM CHLORIDE 0.9 % IR SOLN
Status: DC | PRN
  Administered 2015-09-27: 1

## 2015-09-27 MED ORDER — LACTATED RINGERS IV SOLN
INTRAVENOUS | Status: DC | PRN
  Administered 2015-09-27 (×2): via INTRAVENOUS

## 2015-09-27 MED ORDER — PHENYLEPHRINE 40 MCG/ML (10ML) SYRINGE FOR IV PUSH (FOR BLOOD PRESSURE SUPPORT)
PREFILLED_SYRINGE | INTRAVENOUS | Status: AC
  Filled 2015-09-27: qty 10

## 2015-09-27 MED ORDER — SIMETHICONE 80 MG PO CHEW
80.0000 mg | CHEWABLE_TABLET | ORAL | Status: DC
  Administered 2015-09-28 – 2015-09-29 (×3): 80 mg via ORAL
  Filled 2015-09-27 (×3): qty 1

## 2015-09-27 MED ORDER — DEXAMETHASONE SODIUM PHOSPHATE 10 MG/ML IJ SOLN
INTRAMUSCULAR | Status: DC | PRN
  Administered 2015-09-27: 4 mg via INTRAVENOUS

## 2015-09-27 MED ORDER — SIMETHICONE 80 MG PO CHEW: 80.0000 mg | CHEWABLE_TABLET | ORAL | Status: DC | PRN

## 2015-09-27 MED ORDER — BUTORPHANOL TARTRATE 2 MG/ML IJ SOLN
2.0000 mg | Freq: Once | INTRAMUSCULAR | Status: DC
  Filled 2015-09-27 (×2): qty 2

## 2015-09-27 MED ORDER — LACTATED RINGERS IV SOLN
INTRAVENOUS | Status: DC | PRN
  Administered 2015-09-27 (×2): via INTRAVENOUS

## 2015-09-27 MED ORDER — FENTANYL CITRATE (PF) 100 MCG/2ML IJ SOLN
INTRAMUSCULAR | Status: AC
  Filled 2015-09-27: qty 2

## 2015-09-27 MED ORDER — LIDOCAINE HCL (CARDIAC) 20 MG/ML IV SOLN
INTRAVENOUS | Status: AC
  Filled 2015-09-27: qty 5

## 2015-09-27 MED ORDER — IBUPROFEN 600 MG PO TABS
600.0000 mg | ORAL_TABLET | Freq: Four times a day (QID) | ORAL | Status: DC
  Administered 2015-09-28 – 2015-09-30 (×11): 600 mg via ORAL
  Filled 2015-09-27 (×11): qty 1

## 2015-09-27 MED ORDER — SCOPOLAMINE 1 MG/3DAYS TD PT72
MEDICATED_PATCH | TRANSDERMAL | Status: DC | PRN
  Administered 2015-09-27: 1 via TRANSDERMAL

## 2015-09-27 MED ORDER — LACTATED RINGERS IV SOLN
INTRAVENOUS | Status: DC
  Administered 2015-09-28: 02:00:00 via INTRAVENOUS

## 2015-09-27 MED ORDER — OXYTOCIN 10 UNIT/ML IJ SOLN: 2.5000 [IU]/h | INTRAMUSCULAR | Status: AC

## 2015-09-27 MED ORDER — EPHEDRINE 5 MG/ML INJ: 10.0000 mg | INTRAVENOUS | Status: DC | PRN

## 2015-09-27 MED ORDER — ONDANSETRON HCL 4 MG/2ML IJ SOLN
INTRAMUSCULAR | Status: AC
  Filled 2015-09-27: qty 2

## 2015-09-27 MED ORDER — PHENYLEPHRINE 40 MCG/ML (10ML) SYRINGE FOR IV PUSH (FOR BLOOD PRESSURE SUPPORT): 80.0000 ug | PREFILLED_SYRINGE | INTRAVENOUS | Status: DC | PRN

## 2015-09-27 MED ORDER — FENTANYL CITRATE (PF) 250 MCG/5ML IJ SOLN
INTRAMUSCULAR | Status: AC
  Filled 2015-09-27: qty 5

## 2015-09-27 MED ORDER — DIPHENHYDRAMINE HCL 50 MG/ML IJ SOLN: 12.5000 mg | INTRAMUSCULAR | Status: DC | PRN

## 2015-09-27 MED ORDER — OXYCODONE HCL 5 MG PO TABS
5.0000 mg | ORAL_TABLET | ORAL | Status: DC | PRN
  Administered 2015-09-28 – 2015-09-30 (×6): 5 mg via ORAL
  Filled 2015-09-27 (×6): qty 1

## 2015-09-27 MED ORDER — PROPOFOL 10 MG/ML IV BOLUS
INTRAVENOUS | Status: AC
  Filled 2015-09-27: qty 20

## 2015-09-27 MED ORDER — MIDAZOLAM HCL 2 MG/2ML IJ SOLN
INTRAMUSCULAR | Status: AC
  Filled 2015-09-27: qty 2

## 2015-09-27 MED ORDER — ONDANSETRON HCL 4 MG/2ML IJ SOLN
INTRAMUSCULAR | Status: DC | PRN
  Administered 2015-09-27: 4 mg via INTRAVENOUS

## 2015-09-27 MED ORDER — FENTANYL CITRATE (PF) 100 MCG/2ML IJ SOLN
100.0000 ug | Freq: Once | INTRAMUSCULAR | Status: AC
  Administered 2015-09-27: 100 ug via INTRAVENOUS
  Filled 2015-09-27: qty 2

## 2015-09-27 MED ORDER — MENTHOL 3 MG MT LOZG: 1.0000 | LOZENGE | OROMUCOSAL | Status: DC | PRN

## 2015-09-27 MED ORDER — PHENYLEPHRINE HCL 10 MG/ML IJ SOLN
INTRAMUSCULAR | Status: DC | PRN
  Administered 2015-09-27 (×2): 40 ug via INTRAVENOUS
  Administered 2015-09-27 (×2): 80 ug via INTRAVENOUS
  Administered 2015-09-27: 40 ug via INTRAVENOUS
  Administered 2015-09-27 (×2): 80 ug via INTRAVENOUS
  Administered 2015-09-27: 40 ug via INTRAVENOUS
  Administered 2015-09-27 (×3): 80 ug via INTRAVENOUS
  Administered 2015-09-27: 40 ug via INTRAVENOUS

## 2015-09-27 MED ORDER — DIPHENHYDRAMINE HCL 25 MG PO CAPS: 25.0000 mg | ORAL_CAPSULE | Freq: Four times a day (QID) | ORAL | Status: DC | PRN

## 2015-09-27 MED ORDER — PRENATAL MULTIVITAMIN CH
1.0000 | ORAL_TABLET | Freq: Every day | ORAL | Status: DC
  Administered 2015-09-28 – 2015-09-30 (×3): 1 via ORAL
  Filled 2015-09-27 (×3): qty 1

## 2015-09-27 MED ORDER — SUCCINYLCHOLINE CHLORIDE 20 MG/ML IJ SOLN
INTRAMUSCULAR | Status: DC | PRN
  Administered 2015-09-27: 140 mg via INTRAVENOUS

## 2015-09-27 MED ORDER — DIBUCAINE 1 % RE OINT: 1.0000 "application " | TOPICAL_OINTMENT | RECTAL | Status: DC | PRN

## 2015-09-27 MED ORDER — PROPOFOL 10 MG/ML IV BOLUS
INTRAVENOUS | Status: DC | PRN
  Administered 2015-09-27: 200 mg via INTRAVENOUS

## 2015-09-27 MED ORDER — TETANUS-DIPHTH-ACELL PERTUSSIS 5-2.5-18.5 LF-MCG/0.5 IM SUSP: 0.5000 mL | Freq: Once | INTRAMUSCULAR | Status: DC

## 2015-09-27 MED ORDER — CEFAZOLIN SODIUM-DEXTROSE 2-3 GM-% IV SOLR
INTRAVENOUS | Status: DC | PRN
  Administered 2015-09-27: 2 g via INTRAVENOUS

## 2015-09-27 MED ORDER — OXYTOCIN 10 UNIT/ML IJ SOLN
40.0000 [IU] | INTRAMUSCULAR | Status: DC | PRN
  Administered 2015-09-27: 40 [IU] via INTRAVENOUS

## 2015-09-27 MED ORDER — FENTANYL CITRATE (PF) 100 MCG/2ML IJ SOLN
INTRAMUSCULAR | Status: DC | PRN
  Administered 2015-09-27: 100 ug via INTRAVENOUS
  Administered 2015-09-27 (×2): 25 ug via INTRAVENOUS
  Administered 2015-09-27: 100 ug via INTRAVENOUS

## 2015-09-27 MED ORDER — ESMOLOL HCL 100 MG/10ML IV SOLN
INTRAVENOUS | Status: DC | PRN
  Administered 2015-09-27 (×5): 10 mg via INTRAVENOUS

## 2015-09-27 MED ORDER — FENTANYL 2.5 MCG/ML BUPIVACAINE 1/10 % EPIDURAL INFUSION (WH - ANES)
14.0000 mL/h | INTRAMUSCULAR | Status: DC | PRN
  Filled 2015-09-27: qty 125

## 2015-09-27 MED ORDER — COCONUT OIL OIL
1.0000 "application " | TOPICAL_OIL | Status: DC | PRN
  Administered 2015-09-29: 1 via TOPICAL
  Filled 2015-09-27: qty 120

## 2015-09-27 MED ORDER — OXYCODONE HCL 5 MG PO TABS
10.0000 mg | ORAL_TABLET | ORAL | Status: DC | PRN
  Administered 2015-09-27 – 2015-09-29 (×2): 10 mg via ORAL
  Filled 2015-09-27 (×2): qty 2

## 2015-09-27 MED ORDER — FENTANYL CITRATE (PF) 100 MCG/2ML IJ SOLN
100.0000 ug | INTRAMUSCULAR | Status: DC | PRN
  Administered 2015-09-27 (×2): 100 ug via INTRAVENOUS
  Filled 2015-09-27 (×2): qty 2

## 2015-09-27 MED ORDER — WITCH HAZEL-GLYCERIN EX PADS: 1.0000 "application " | MEDICATED_PAD | CUTANEOUS | Status: DC | PRN

## 2015-09-27 MED ORDER — MIDAZOLAM HCL 2 MG/2ML IJ SOLN
INTRAMUSCULAR | Status: DC | PRN
Start: 2015-09-27 — End: 2015-09-27
  Administered 2015-09-27: 2 mg via INTRAVENOUS

## 2015-09-27 MED ORDER — MENTHOL 3 MG MT LOZG
1.0000 | LOZENGE | OROMUCOSAL | Status: DC | PRN
  Administered 2015-09-28: 3 mg via ORAL
  Filled 2015-09-27: qty 9

## 2015-09-27 MED ORDER — SUCCINYLCHOLINE CHLORIDE 20 MG/ML IJ SOLN
INTRAMUSCULAR | Status: AC
  Filled 2015-09-27: qty 1

## 2015-09-27 MED ORDER — SENNOSIDES-DOCUSATE SODIUM 8.6-50 MG PO TABS
2.0000 | ORAL_TABLET | ORAL | Status: DC
  Administered 2015-09-28 – 2015-09-29 (×3): 2 via ORAL
  Filled 2015-09-27 (×3): qty 2

## 2015-09-27 MED ORDER — SIMETHICONE 80 MG PO CHEW
80.0000 mg | CHEWABLE_TABLET | Freq: Three times a day (TID) | ORAL | Status: DC
  Administered 2015-09-28 – 2015-09-29 (×5): 80 mg via ORAL
  Filled 2015-09-27 (×5): qty 1

## 2015-09-27 MED ORDER — OXYTOCIN 10 UNIT/ML IJ SOLN
INTRAMUSCULAR | Status: AC
  Filled 2015-09-27: qty 4

## 2015-09-27 MED ORDER — LIDOCAINE HCL (CARDIAC) 20 MG/ML IV SOLN
INTRAVENOUS | Status: DC | PRN
  Administered 2015-09-27: 100 mg via INTRAVENOUS

## 2015-09-27 MED ORDER — FERROUS SULFATE 325 (65 FE) MG PO TABS
325.0000 mg | ORAL_TABLET | Freq: Two times a day (BID) | ORAL | Status: DC
  Administered 2015-09-28 – 2015-09-30 (×5): 325 mg via ORAL
  Filled 2015-09-27 (×5): qty 1

## 2015-09-27 MED ORDER — LACTATED RINGERS IV SOLN: 500.0000 mL | Freq: Once | INTRAVENOUS | Status: DC

## 2015-09-27 MED ORDER — ACETAMINOPHEN 325 MG PO TABS
650.0000 mg | ORAL_TABLET | ORAL | Status: DC | PRN
  Administered 2015-09-28 – 2015-09-29 (×4): 650 mg via ORAL
  Filled 2015-09-27 (×4): qty 2

## 2015-09-27 SURGICAL SUPPLY — 37 items
BENZOIN TINCTURE PRP APPL 2/3 (GAUZE/BANDAGES/DRESSINGS) ×3 IMPLANT
CHLORAPREP W/TINT 26ML (MISCELLANEOUS) ×3 IMPLANT
CLAMP CORD UMBIL (MISCELLANEOUS) IMPLANT
CLOSURE STERI STRIP 1/2 X4 (GAUZE/BANDAGES/DRESSINGS) ×3 IMPLANT
CLOSURE WOUND 1/2 X4 (GAUZE/BANDAGES/DRESSINGS)
CLOTH BEACON ORANGE TIMEOUT ST (SAFETY) ×3 IMPLANT
DRAPE C SECTION CLR SCREEN (DRAPES) ×3 IMPLANT
DRSG OPSITE POSTOP 4X10 (GAUZE/BANDAGES/DRESSINGS) ×3 IMPLANT
ELECT REM PT RETURN 9FT ADLT (ELECTROSURGICAL) ×3
ELECTRODE REM PT RTRN 9FT ADLT (ELECTROSURGICAL) ×1 IMPLANT
EXTRACTOR VACUUM KIWI (MISCELLANEOUS) IMPLANT
GLOVE BIO SURGEON STRL SZ 6.5 (GLOVE) ×2 IMPLANT
GLOVE BIO SURGEONS STRL SZ 6.5 (GLOVE) ×1
GLOVE BIOGEL PI IND STRL 7.0 (GLOVE) ×1 IMPLANT
GLOVE BIOGEL PI INDICATOR 7.0 (GLOVE) ×2
GOWN STRL REUS W/TWL LRG LVL3 (GOWN DISPOSABLE) ×6 IMPLANT
KIT ABG SYR 3ML LUER SLIP (SYRINGE) IMPLANT
NEEDLE HYPO 25X5/8 SAFETYGLIDE (NEEDLE) IMPLANT
NS IRRIG 1000ML POUR BTL (IV SOLUTION) ×3 IMPLANT
PACK C SECTION WH (CUSTOM PROCEDURE TRAY) ×3 IMPLANT
PAD OB MATERNITY 4.3X12.25 (PERSONAL CARE ITEMS) ×3 IMPLANT
PENCIL SMOKE EVAC W/HOLSTER (ELECTROSURGICAL) ×3 IMPLANT
RTRCTR C-SECT PINK 25CM LRG (MISCELLANEOUS) IMPLANT
STRIP CLOSURE SKIN 1/2X4 (GAUZE/BANDAGES/DRESSINGS) IMPLANT
SUT CHROMIC 1 CTX 36 (SUTURE) ×6 IMPLANT
SUT PLAIN 0 NONE (SUTURE) IMPLANT
SUT PLAIN 2 0 XLH (SUTURE) ×3 IMPLANT
SUT VIC AB 0 CT1 27 (SUTURE) ×4
SUT VIC AB 0 CT1 27XBRD ANBCTR (SUTURE) ×2 IMPLANT
SUT VIC AB 2-0 CT1 (SUTURE) ×3 IMPLANT
SUT VIC AB 2-0 CT1 27 (SUTURE) ×2
SUT VIC AB 2-0 CT1 TAPERPNT 27 (SUTURE) ×1 IMPLANT
SUT VIC AB 3-0 CT1 27 (SUTURE)
SUT VIC AB 3-0 CT1 TAPERPNT 27 (SUTURE) IMPLANT
SUT VIC AB 4-0 KS 27 (SUTURE) ×3 IMPLANT
TOWEL OR 17X24 6PK STRL BLUE (TOWEL DISPOSABLE) ×3 IMPLANT
TRAY FOLEY CATH SILVER 14FR (SET/KITS/TRAYS/PACK) ×3 IMPLANT

## 2015-09-27 NOTE — Progress Notes (Signed)
This note also relates to the following rows which could not be included: Pulse Rate - Cannot attach notes to unvalidated device data SpO2 - Cannot attach notes to unvalidated device data   Dr Desmond Lopeurk unable to place epidural due to pt hx  Of brain tumor.

## 2015-09-27 NOTE — Op Note (Signed)
Preop dx- IUP at 3839 1/7wks, GDMA2, maternal request for c/s Post op dx - same Procedure - Primary low transverse c/s Complications - none Anesthesia - general ( mother with residual brain tumor) EBL - 1000ml UOP - concentrated, clear Counts - correct x 2 Findings - viable female infant in cephalic presentation Disposition - stable to recovery room   Procedure note  Patient was taken to the operating room where she was prepped and draped in the normal sterile fashion and placed  in the dorsal supine position with a leftward tilt. An appropriate time out was performed. FHTs had been confirmed in the 140s. General anesthesia was administered and a pfannenstiel skin incision was then made with the scalpel 1 finger breath above the pubic symphysis and carried through to the underlying layer of fascia by sharp dissection. The fascia was nicked in the midline and the incision was extended laterally bluntly. The superior and  inferior aspect of the fascia were separated bluntly cephalad and caudad, the underlying rectus muscles were also separated in the midline and the peritoneal cavity entered bluntly.  The Alexis self-retaining wound retractor was then placed within the incision and the lower uterine segment exposed. . The lower uterine segment was then incised in a transverse fashion above the bladder reflection and the cavity itself entered. The incision was extended bluntly. The infant's head was palpated returned into mid uterus and delivered easily. Anterior and posterior shoulders followed and the remainder of the infant delivered easily. Nose and mouth were  bulb suctioned with the cord clamped and cut . The infant was handed off to the waiting pediatricians. The placenta was then spontaneously expressed from the uterus and the uterus cleared of all clots and debris with moist lap sponge. The uterine incision was then repaired in 1 running locked layer using 0 chromic - a second layer was imbricated  using the same type of suture. The tubes and ovaries were inspected and the gutters cleared of all clots and debris. The uterine incision was inspected again and found to be hemostatic. All instruments and sponges as well as the Alexis retractor were then removed from the abdomen. The peritoneum was then reapproximated in a purse string fashion and the rectus muscle in a loose figure 8 with 2-0 Vicryl. The fascia was then closed with 0 Vicryl in a running fashion. Subcutaneous tissue was reapproximated with 2-0 plain in a running fashion. The skin was closed with a subcuticular stitch of 4-0 Vicryl on a Keith needle and then reinforced with a honeycomb and pressure dressing. At the conclusion of the procedure all instruments and sponge counts were correct. Patient was awakened and taken to the recovery room in good condition. Apgars for baby were 9 and 9. Weight pending

## 2015-09-27 NOTE — Progress Notes (Signed)
Unable to reach Dr Mindi SlickerBanga for 20 min. To request pain med order, Her phone ringer was off

## 2015-09-27 NOTE — Anesthesia Postprocedure Evaluation (Signed)
Anesthesia Post Note  Patient: Stephanie HughsRebecca G [name]  Procedure(s) Performed: Procedure(s) (LRB): CESAREAN SECTION (N/A)  Patient location during evaluation: PACU Anesthesia Type: General Level of consciousness: awake and alert, oriented and awake Pain management: pain level controlled Vital Signs Assessment: post-procedure vital signs reviewed and stable Respiratory status: spontaneous breathing, nonlabored ventilation, respiratory function stable and patient connected to nasal cannula oxygen Cardiovascular status: blood pressure returned to baseline and stable Postop Assessment: no signs of nausea or vomiting Anesthetic complications: no     Last Vitals:  Filed Vitals:   09/27/15 1900 09/27/15 1915  BP: 131/89 121/89  Pulse: 112 103  Temp:    Resp: 15 23    Last Pain:  Filed Vitals:   09/27/15 1918  PainSc: Asleep   Pain Goal: Patients Stated Pain Goal: 10 (09/27/15 0700)               Cecile HearingStephen Edward Oley Lahaie

## 2015-09-27 NOTE — Progress Notes (Signed)
This note also relates to the following rows which could not be included: Pulse Rate - Cannot attach notes to unvalidated device data SpO2 - Cannot attach notes to unvalidated device data   Dr Mindi SlickerBanga in room discussing reasoning for C-Section

## 2015-09-27 NOTE — Progress Notes (Signed)
Patient ID: Stephanie [name], female   DOB: 12-17-1975, 40 y.o.   MRN: [id number] Pt with uncontrollable urg to push On exam still 8-9cm - cervix still present on right and around to anterior aspect.  Unable to gt epidural due to residual brain tumor remaining - per consult with anesthesia One more dose of fentynyl given Per anesthesia if has to have a c/s will need general anesthesia Pt aware; will continue to labor and attempt for svd

## 2015-09-27 NOTE — Transfer of Care (Signed)
Immediate Anesthesia Transfer of Care Note  Patient: Stephanie [name]  Procedure(s) Performed: Procedure(s): CESAREAN SECTION (N/A)  Patient Location: PACU  Anesthesia Type:General  Level of Consciousness: awake and alert   Airway & Oxygen Therapy: Patient Spontanous Breathing and Patient connected to nasal cannula oxygen  Post-op Assessment: Report given to RN and Post -op Vital signs reviewed and stable  Post vital signs: Reviewed and stable  Last Vitals:  Filed Vitals:   09/27/15 1448 09/27/15 1535  BP:    Pulse: 92 107  Temp:    Resp:      Last Pain:  Filed Vitals:   09/27/15 1538  PainSc: 8       Patients Stated Pain Goal: 10 (09/27/15 0700)  Complications: No apparent anesthesia complications

## 2015-09-27 NOTE — Consult Note (Signed)
Neonatology Note:   Attendance at C-section:    I was asked by Dr. Mindi SlickerBanga to attend this repeat C/S at term under general anesthesia for maternal request/indications (h/o brain tumor). The mother is a G5P3, GBS negative with good prenatal care. Pregnancy complicated by GDMA2 on insulin, obesity and concern for LGA.  Received stadol and fentanyl <2h before delivery under general anesthesia. ROM 0 hours before delivery, fluid clear. Infant vigorous with good spontaneous cry and tone. Needed only minimal bulb suctioning. Ap 8/9. Lungs clear to ausc in DR with good tone and alertness. To CN to care of Pediatrician.  Dineen Kidavid C. Leary RocaEhrmann, MD

## 2015-09-27 NOTE — Anesthesia Preprocedure Evaluation (Signed)
Anesthesia Evaluation  Patient identified by MRN, date of birth, ID band Patient awake    Reviewed: Allergy & Precautions, NPO status , Patient's Chart, lab work & pertinent test results  History of Anesthesia Complications Negative for: history of anesthetic complications  Airway Mallampati: II  TM Distance: >3 FB Neck ROM: Full    Dental  (+) Teeth Intact, Dental Advisory Given   Pulmonary neg pulmonary ROS,    Pulmonary exam normal breath sounds clear to auscultation       Cardiovascular negative cardio ROS Normal cardiovascular exam Rhythm:Regular Rate:Normal     Neuro/Psych  Headaches, +Headaches, blurred vision [facility] (from [facility] on 08/09/14) --2 years s/p right sphenoid wing meningioma resection. This had invaded the orbit and lateral rectus, and she underwent gamma knife for this portion in [id number]. --small lateral rectus and superior orbital fissure residual tumor     GI/Hepatic negative GI ROS, Neg liver ROS,   Endo/Other  diabetes, Gestational, Oral Hypoglycemic AgentsObesity   Renal/GU negative Renal ROS     Musculoskeletal negative musculoskeletal ROS (+)   Abdominal   Peds  Hematology  (+) Blood dyscrasia, anemia , Plt 204k   Anesthesia Other Findings Day of surgery medications reviewed with the patient.  Reproductive/Obstetrics (+) Pregnancy                             Anesthesia Physical Anesthesia Plan  ASA: III and emergent  Anesthesia Plan: General   Post-op Pain Management:    Induction: Intravenous  Airway Management Planned: Oral ETT and Video Laryngoscope Planned  Additional Equipment:   Intra-op Plan:   Post-operative Plan: Extubation in OR  Informed Consent: I have reviewed the patients History and Physical, chart, labs and discussed the procedure including the risks, benefits and alternatives for the proposed anesthesia with the  patient or authorized representative who has indicated his/her understanding and acceptance.   Dental advisory given  Plan Discussed with: CRNA  Anesthesia Plan Comments: (Risks/benefits of general anesthesia discussed with patient including risk of damage to teeth, lips, gum, and tongue, nausea/vomiting, allergic reactions to medications, and the possibility of heart attack, stroke and death.  All patient questions answered.  Patient wishes to proceed.  Patient with meningioma + blurred vision, headaches. History of craniotomy with partial resection of meningioma. Last MRI 3/16, last neurosurgeon visit 3/16. New symptoms started 2/17. Have not seen neurosurgeon since start of symptoms. )        Anesthesia Quick Evaluation

## 2015-09-27 NOTE — Anesthesia Procedure Notes (Signed)
Procedure Name: Intubation Date/Time: 09/27/2015 4:12 PM Performed by: Tony Granquist G Pre-anesthesia Checklist: Patient identified, Emergency Drugs available, Suction available, Patient being monitored and Timeout performed Patient Re-evaluated:Patient Re-evaluated prior to inductionOxygen Delivery Method: Circle system utilized Preoxygenation: Pre-oxygenation with 100% oxygen Intubation Type: IV induction, Rapid sequence and Cricoid Pressure applied Laryngoscope Size: Glidescope and 3 Grade View: Grade I Tube type: Oral Tube size: 6.5 mm Number of attempts: 1 Airway Equipment and Method: Video-laryngoscopy Placement Confirmation: ETT inserted through vocal cords under direct vision,  positive ETCO2 and breath sounds checked- equal and bilateral Secured at: 20 cm Tube secured with: Tape Dental Injury: Teeth and Oropharynx as per pre-operative assessment

## 2015-09-27 NOTE — Progress Notes (Signed)
Stephanie HughsRebecca G [name] is a 40 y.o. [id number] at 6792w1d by ultrasound admitted for induction of labor due to Gestational diabetes.  Subjective:   Objective: BP 127/84 mmHg  Pulse 95  Temp(Src) 98.6 F (37 C) (Oral)  Resp 20  Ht 5\' 10"  (1.778 m)  Wt 210 lb (95.255 kg)  BMI 30.13 kg/m2  LMP 12/23/2014 (Approximate)      FHT:  FHR: 140 bpm, variability: moderate,  accelerations:  Present,  decelerations:  Absent UC:   regular, every 4 minutes SVE:   Dilation: 6 Effacement (%): 70 Station: -2 Exam by:: [name]  Labs: Lab Results  Component Value Date   WBC 11.3* 09/26/2015   HGB 10.9* 09/26/2015   HCT 32.6* 09/26/2015   MCV 83.0 09/26/2015   PLT 204 09/26/2015    Assessment / Plan: S/p pitocin then two doses of cytotec overnight - tolerated well  Now 5.5/65/-2 by my exam AROM performed with moderate return of clear amniotic fluid.  Will recheck in 2hours and augment with pitocin if indicated Pain control prn - hopes for pain free delivery  Labor: see above Preeclampsia:  n/a Fetal Wellbeing:  Category I Pain Control:  Labor support without medications I/D:  n/a Anticipated MOD:  NSVD  Stephanie [name] 09/27/2015, 8:27 AM

## 2015-09-27 NOTE — Anesthesia Preprocedure Evaluation (Addendum)
Anesthesia Evaluation  Patient identified by MRN, date of birth, ID band Patient awake    Reviewed: Allergy & Precautions, NPO status , Patient's Chart, lab work & pertinent test results  Airway Mallampati: II  TM Distance: >3 FB Neck ROM: Full    Dental  (+) Teeth Intact, Dental Advisory Given   Pulmonary neg pulmonary ROS,    Pulmonary exam normal breath sounds clear to auscultation       Cardiovascular negative cardio ROS Normal cardiovascular exam Rhythm:Regular Rate:Normal     Neuro/Psych  Headaches, +Headaches, blurred vision Seen by [facility] Neurosurgery: (from visit on 08/09/14) --2 years s/p right sphenoid wing meningioma resection. This had invaded the orbit and lateral rectus, and she underwent gamma knife for this portion in [id number]. --small lateral rectus and superior orbital fissure residual tumor     GI/Hepatic negative GI ROS, Neg liver ROS,   Endo/Other  diabetes, Gestational, Oral Hypoglycemic AgentsObesity   Renal/GU negative Renal ROS     Musculoskeletal negative musculoskeletal ROS (+)   Abdominal   Peds  Hematology  (+) Blood dyscrasia, anemia ,   Anesthesia Other Findings Day of surgery medications reviewed with the patient.  Reproductive/Obstetrics                          Anesthesia Physical Anesthesia Plan  ASA: III  Anesthesia Plan:    Post-op Pain Management:    Induction:   Airway Management Planned:   Additional Equipment:   Intra-op Plan:   Post-operative Plan:   Informed Consent:   Plan Discussed with:   Anesthesia Plan Comments: (Met with patient requesting epidural for labor analgesia.  During questioning, patient informed me that she had residual brain tumor following meningioma resection at [facility] in 2014.  She reports epidural placement with 1st pregnancy, but this was before she knew about the meningioma.  Patient endorses blurred vision  and headaches for which she has seen her ophthalmologist, but she has not seen her Neurosurgeon since 08/09/14.  She has a scheduled followup with [facility] on 10/06/15.  She states the blurred vision and headaches started in February 2017.  Upon chart review, able to access Neurosurgery note from 08/09/14 which shows patient underwent craniotomy for right sphenoid wing meningioma with hyperostosis, proptosis, invasion into the periorbita and orbit 12/11/12.  Per note, MRI from 3/16 showed no change or growth in the small lateral rectus and superior orbital fissure residual tumor.  Note specifically states patient was symptom free at time of visit.  Discussed my concerns with possible elevated intracranial pressure given history of residual brain tumor, no imaging in over 1 year, and current neurologic symptoms endorsed by the patient on my exam today.  We discussed risks of epidural and contraindication to neuraxial anesthesia in patients with elevated intracranial pressures due to risk of brain herniation.  We agreed to defer epidural placement at this time as it has been over a year since last neurosurgery visit and patient has had interval development of neurologic symptoms that could be related to increased ICP.  If patient comes for C-section, will proceed with general anesthesia for C-section.  )      Anesthesia Quick Evaluation

## 2015-09-27 NOTE — Progress Notes (Signed)
Patient ID: Stephanie HughsRebecca G [name], female   DOB: 01-25-1976, 40 y.o.   MRN: [id number] Cervix now just a rim. Pt requesting c/s. Does not want to attempt pushing to see if cervix reducible.  Staff notified; consent signed.  To OR for primary c/s R/B reviewed

## 2015-09-28 ENCOUNTER — Encounter (HOSPITAL_COMMUNITY): Payer: Self-pay | Admitting: *Deleted

## 2015-09-28 LAB — CBC
HCT: 25.9 % — ABNORMAL LOW (ref 36.0–46.0)
HEMATOCRIT: 24.3 % — AB (ref 36.0–46.0)
HEMOGLOBIN: 8.2 g/dL — AB (ref 12.0–15.0)
HEMOGLOBIN: 8.6 g/dL — AB (ref 12.0–15.0)
MCH: 27.9 pg (ref 26.0–34.0)
MCH: 27.6 pg (ref 26.0–34.0)
MCHC: 33.7 g/dL (ref 30.0–36.0)
MCHC: 33.2 g/dL (ref 30.0–36.0)
MCV: 82.7 fL (ref 78.0–100.0)
MCV: 83 fL (ref 78.0–100.0)
PLATELETS: 207 10*3/uL (ref 150–400)
Platelets: 188 10*3/uL (ref 150–400)
RBC: 2.94 MIL/uL — ABNORMAL LOW (ref 3.87–5.11)
RBC: 3.12 MIL/uL — AB (ref 3.87–5.11)
RDW: 14.2 % (ref 11.5–15.5)
RDW: 14.3 % (ref 11.5–15.5)
WBC: 19.9 10*3/uL — AB (ref 4.0–10.5)
WBC: 19.8 10*3/uL — AB (ref 4.0–10.5)

## 2015-09-28 NOTE — Progress Notes (Signed)
Subjective: Postpartum Day 1: Cesarean Delivery Patient reports incisional pain, tolerating PO, + flatus and no problems voiding. Pain controlled with meds however. No fever or chills or headaches. No lightheadedness with ambulating.  Bonding well with baby. Breastfeeding  Objective: Vital signs in last 24 hours: Temp:  [97.9 F (36.6 C)-99.8 F (37.7 C)] 97.9 F (36.6 C) (05/07 0658) Pulse Rate:  [90-142] 106 (05/07 0658) Resp:  [12-20] 18 (05/07 0658) BP: (107-138)/(64-89) 107/86 mmHg (05/07 0658) SpO2:  [94 %-100 %] 100 % (05/07 16100658)  Physical Exam:  General: alert, cooperative and no distress Lochia: appropriate Uterine Fundus: firm Incision: no significant drainage DVT Evaluation: No evidence of DVT seen on physical exam. Calf/Ankle edema is present.   Recent Labs  09/26/15 1150 09/28/15 0359  HGB 10.9* 8.2*  HCT 32.6* 24.3*    Assessment/Plan: Status post Cesarean section. Doing well postoperatively.  Continue current care Recheck cbc and start on antibiotics if s/sx of infection .  Mare LoanCecilia Worema Banga 09/28/2015, 11:35 AM

## 2015-09-28 NOTE — Anesthesia Postprocedure Evaluation (Signed)
Anesthesia Post Note  Patient: Matthias HughsRebecca G Lenis  Procedure(s) Performed: * No procedures listed *  Patient location during evaluation: Mother Baby Anesthesia Type: General Level of consciousness: awake and awake and alert Pain management: pain level controlled Vital Signs Assessment: vitals unstable Respiratory status: spontaneous breathing Cardiovascular status: stable Postop Assessment: no signs of nausea or vomiting Comments: Pain level 4.  Pt states pain manageable no need for additional pain medications/interventions.     Last Vitals:  Filed Vitals:   09/28/15 0500 09/28/15 0658  BP: 110/74 107/86  Pulse: 102 106  Temp: 36.7 C 36.6 C  Resp:  18    Last Pain:  Filed Vitals:   09/28/15 0742  PainSc: 5    Pain Goal: Patients Stated Pain Goal: 4 (09/28/15 0700)               Merrilyn PumaWRINKLE,Onetta Spainhower

## 2015-09-29 ENCOUNTER — Encounter (HOSPITAL_COMMUNITY): Payer: Self-pay | Admitting: Obstetrics and Gynecology

## 2015-09-29 LAB — CBC
HCT: 22.8 % — ABNORMAL LOW (ref 36.0–46.0)
Hemoglobin: 7.7 g/dL — ABNORMAL LOW (ref 12.0–15.0)
MCH: 27.8 pg (ref 26.0–34.0)
MCHC: 33.8 g/dL (ref 30.0–36.0)
MCV: 82.3 fL (ref 78.0–100.0)
Platelets: 200 10*3/uL (ref 150–400)
RBC: 2.77 MIL/uL — ABNORMAL LOW (ref 3.87–5.11)
RDW: 14.3 % (ref 11.5–15.5)
WBC: 14.7 10*3/uL — AB (ref 4.0–10.5)

## 2015-09-29 NOTE — Progress Notes (Signed)
Subjective: Postpartum Day 2: Cesarean Delivery Patient reports tolerating PO, + flatus and no problems voiding.  C/o general aches and pain - no pain meds since yesterday afternoon. Breastfeeding well. Lochia scant. No fever or chills.   Objective: Vital signs in last 24 hours: Temp:  [97.9 F (36.6 C)-98.6 F (37 C)] 98.6 F (37 C) (05/08 0555) Pulse Rate:  [92-106] 95 (05/08 0555) Resp:  [18] 18 (05/08 0555) BP: (107-119)/(71-86) 116/71 mmHg (05/08 0555) SpO2:  [97 %-100 %] 97 % (05/07 1148)  Physical Exam:  General: alert, cooperative, fatigued and no distress Lochia: appropriate Uterine Fundus: firm Incision: no significant drainage, no significant erythema DVT Evaluation: No evidence of DVT seen on physical exam. No significant calf/ankle edema.   Recent Labs  09/28/15 1124 09/29/15 0527  HGB 8.6* 7.7*  HCT 25.9* 22.8*    Assessment/Plan: Status post Cesarean section. Doing well postoperatively. WBC trending downwards.  Continue current care.  Mare LoanCecilia Worema Banga 09/29/2015, 6:46 AM

## 2015-09-29 NOTE — Lactation Note (Signed)
This note was copied from a baby's chart. Lactation Consultation Note  Patient Name: Stephanie Laurena BeringRebecca Armbrister NWGNF'AToday's Date: 09/29/2015 Reason for consult: Initial assessment Mom reports baby is nursing well, some nipple tenderness. Mom reports baby cluster fed during the night. Mom using EBM/coconut oil for comfort and reports relief. Mom denies questions/concerns. Exp BF. Lactation brochure left for review, advised of OP services and support group. Encouraged to call for assist if desired.   Maternal Data Does the patient have breastfeeding experience prior to this delivery?: Yes  Feeding    LATCH Score/Interventions                      Lactation Tools Discussed/Used Tools: Pump Breast pump type: Manual   Consult Status Consult Status: Complete    Alfred LevinsGranger, Dann Galicia Ann 09/29/2015, 1:17 PM

## 2015-09-30 MED ORDER — OXYCODONE-ACETAMINOPHEN 5-325 MG PO TABS: 1.0000 | ORAL_TABLET | ORAL | Status: AC | PRN

## 2015-09-30 MED ORDER — IBUPROFEN 600 MG PO TABS: 600.0000 mg | ORAL_TABLET | Freq: Four times a day (QID) | ORAL | Status: AC

## 2015-09-30 NOTE — Discharge Summary (Signed)
OB Discharge Summary     Patient Name: Stephanie [name] DOB: [birth year] MRN: [id number]  Date of admission: 09/26/2015 Delivering MD: Pryor Ochoa [facility]   Date of discharge: 09/30/2015  Admitting diagnosis: EARLY LABOR Intrauterine pregnancy: [redacted]w[redacted]d     Secondary diagnosis:  Active Problems:   GDM, class A2   S/P cesarean section   Postpartum care following cesarean delivery  Additional problems: anemia     Discharge diagnosis: Term Pregnancy Delivered and GDM A2                                                                                                Post partum procedures:none  Augmentation: AROM, Pitocin and Cytotec  Complications: None  Hospital course:  Induction of Labor With Cesarean Section  40 y.o. yo G5P1011 at [redacted]w[redacted]d was admitted to the hospital 09/26/2015 for induction of labor. Patient had a labor course significant for no complications. The patient went for cesarean section due to Elective Primary, and delivered a Viable infant,@BABYSUPPRESS (DBLINK,ept,110,,1,,) Membrane Rupture Time/Date: )8:13 AM ,09/27/2015    of operation can be found in separate operative Note.  Patient had an uncomplicated postpartum course. She is ambulating, tolerating a regular diet, passing flatus, and urinating well.  Patient is discharged home in stable condition on 09/30/2015.                                     Physical exam  Filed Vitals:   09/28/15 1800 09/29/15 0555 09/29/15 1833 09/30/15 0550  BP: 119/71 116/71 121/68 115/78  Pulse: 94 95 94 90  Temp: 98.2 F (36.8 C) 98.6 F (37 C) 98.9 F (37.2 C) 97.7 F (36.5 C)  TempSrc: Oral Oral Oral Oral  Resp: Height:      Weight:      SpO2:    98%   General: alert, cooperative and no distress Lochia: appropriate Uterine Fundus: firm Incision: Healing well with no significant drainage, Dressing is clean, dry, and intact DVT Evaluation: No evidence of DVT seen on physical exam. Calf/Ankle edema is  present Labs: Lab Results  Component Value Date   WBC 14.7* 09/29/2015   HGB 7.7* 09/29/2015   HCT 22.8* 09/29/2015   MCV 82.3 09/29/2015   PLT 200 09/29/2015   CMP Latest Ref Rng 09/26/2015  Glucose 65 - 99 mg/dL 70  BUN 6 - 20 mg/dL <7(W)  Creatinine 2.95 - 1.00 mg/dL 6.21  Sodium 308 - 657 mmol/L 138  Potassium 3.5 - 5.1 mmol/L 3.4(L)  Chloride 101 - 111 mmol/L 109  CO2 22 - 32 mmol/L 18(L)  Calcium 8.9 - 10.3 mg/dL 9.3  Total Protein 6.0 - 8.3 g/dL -  Total Bilirubin 0.3 - 1.2 mg/dL -  Alkaline Phos 39 - 846 U/L -  AST 0 - 37 U/L -  ALT 0 - 35 U/L -    Discharge instruction: per After Visit Summary and "Baby and Me Booklet".  After visit meds:    Medication List  STOP taking these medications        glyBURIDE 2.5 MG tablet  Commonly known as:  DIABETA      TAKE these medications        acetaminophen 500 MG tablet  Commonly known as:  TYLENOL  Take 2 tablets (1,000 mg total) by mouth every 6 (six) hours as needed for moderate pain, fever or headache.     docusate sodium 100 MG capsule  Commonly known as:  COLACE  Take 100 mg by mouth daily.     FOLIC ACID PO  Take 1 tablet by mouth daily.     ibuprofen 600 MG tablet  Commonly known as:  ADVIL,MOTRIN  Take 1 tablet (600 mg total) by mouth every 6 (six) hours.     IRON PO  Take 1 tablet by mouth daily.     oxyCODONE-acetaminophen 5-325 MG tablet  Commonly known as:  ROXICET  Take 1 tablet by mouth every 4 (four) hours as needed for severe pain.     prenatal multivitamin Tabs tablet  Take 1 tablet by mouth daily at 12 noon.        Diet: routine diet  Activity: Advance as tolerated. Pelvic rest for 6 weeks.   Outpatient follow up:2 weeks Follow up Appt:No future appointments. Follow up Visit:No Follow-up on file.  Postpartum contraception: Undecided and possible paraguard vs vasectomy  Newborn Data: Live born female  Birth Weight: 9 lb 8.9 oz (4335 g) APGAR: 8, 9  Baby Feeding:  Breast Disposition:home with mother   09/30/2015 Sharol Givenecilia Worema Banga, DO

## 2015-09-30 NOTE — Discharge Instructions (Signed)
Nothing in vagina for 6 weeks.  No sex, tampons, and douching.  Other instructions as in [facility] Discharge Booklet. °

## 2015-09-30 NOTE — Lactation Note (Signed)
This note was copied from a baby's chart. Lactation Consultation Note: Observed mother independently latching infant.  Infant is at 8 % weight loss. Mother states that she is not having any difficulty.  Mother is aware of available LC services and community support.    Patient Name: Girl Laurena BeringRebecca Pasion WUJWJ'XToday's Date: 09/30/2015 Reason for consult: Follow-up assessment   Maternal Data    Feeding Feeding Type: Breast Fed  LATCH Score/Interventions Latch: Grasps breast easily, tongue down, lips flanged, rhythmical sucking.  Audible Swallowing: Spontaneous and intermittent  Type of Nipple: Everted at rest and after stimulation  Comfort (Breast/Nipple): Filling, red/small blisters or bruises, mild/mod discomfort     Hold (Positioning): No assistance needed to correctly position infant at breast.  LATCH Score: 9  Lactation Tools Discussed/Used     Consult Status Consult Status: Complete    Michel BickersKendrick, Dearis Danis McCoy 09/30/2015, 10:33 AM

## 2015-09-30 NOTE — Progress Notes (Signed)
Patient ID: Stephanie [name], female   DOB: 1976-04-05, 40 y.o.   MRN: [id number] Pt doing well. Denies any fever, chills lightheadedness or headaches. Lochia mild. Tolerating diet, voiding well, breastfeeding well. Ready for discharge to home today VSS ABD - soft, incision c/d/i EXT - mild edema, no Homans  WBC 5/8 14  A/P: POD# 3 s/p pltc/s- stable         Discharge to home- instructions reviewed

## 2015-10-01 ENCOUNTER — Inpatient Hospital Stay (HOSPITAL_COMMUNITY): Payer: 59

## 2015-10-01 ENCOUNTER — Telehealth (HOSPITAL_COMMUNITY): Payer: Self-pay

## 2015-10-01 NOTE — Telephone Encounter (Signed)
Mom had a question regarding why she was given coconut oil vs. Lanolin. Discussed reasons with her. Also answered her questions regarding avoidance of certain foods.  Reassured her that she did not have to limit her diet for the most part. She did have a question about raw sushi and was referred to her doctor or the pediatrician.

## 2015-11-03 ENCOUNTER — Telehealth (HOSPITAL_COMMUNITY): Payer: Self-pay

## 2015-11-03 NOTE — Telephone Encounter (Signed)
Baby is spitting up, mom has tried pumping and bottle feeding and continues to spit up a lot.  Encouraged to work on good burping for baby.    Encouraged mom to try laid back nursing as she feels she may have over supply/ strong let down.  Mom has appointment 6/20.  Questions answered at this time.

## 2015-11-11 ENCOUNTER — Ambulatory Visit (HOSPITAL_COMMUNITY)
Admission: RE | Admit: 2015-11-11 | Discharge: 2015-11-11 | Disposition: A | Discharge status: Home / Self Care | Payer: 59 | Source: Ambulatory Visit | Attending: Obstetrics and Gynecology | Admitting: Obstetrics and Gynecology

## 2015-11-11 NOTE — Lactation Note (Signed)
Lactation Consult  Mother's reason for visit:  "spitting up a lot and gassy and pooping on average 1-2 times a day" Visit Type:  OP Appointment Notes:  Stephanie [name] is here today because Stephanie [name] is regurgitating after feeding and is gassy. She is also concerned because her bowel movements can be only once a day. Oral  Evaluation: Stephanie's tongue has a white coating on it from the middle 1/3 to the posterior portion suggesting that she does not elevate that portion to the roof of her mouth.  She also has an anterior bubble palate and tongue extension is down rather than straight out. Occasionally central retraction is noted.  A frenum is noted under the tongue with exam and it can be felt with finger sweep.The upper gum blanches when flanged and there is a thick flesh labial frenum noted that inserts just above the alveolar ridge. Difficulty flanging lower lip.  Stephanie [name] usually feeds on both breasts. At this feeding she attached and snapback was noted. Helped mom with a deeper latch and snap back ceased.  Mom reports that usually it stops after the first few suckles.  Stephanie [name] appears to suckle well but Mom has a strong MER which is contributing to transfer.  Once the initial MER is over Stephanie [name] slows down with suckling and her jaw starts to quiver (fatigue).  I am concerned that as Stephanie [name] becomes more responsible for MS it will decrease.  WHen she detached from the right breast she had transferred 88 ml.  SHe was repositioned on the same breast in a different position to facilitated more transfer of hind milk.  Her total transfer was  104 ml. She did regurgitate a small amount of clear milk tinged fluid but was not in any distress.    Plan is to use use an off-center latch and feed on the same side using different positions to encourage hind milk transfer and breast drainage.  Encouraged to post-pump to guard against hindmilk-fore milk imbalance. Mom has resources to learn about oral restrictions.   Consult:  Initial Lactation  Consultant:  Stephanie DryerJoseph, Stephanie [name]  ________________________________________________________________________  Stephanie FloresBaby's Name: Stephanie [name] Date of Birth: 09/27/2013 Pediatrician: Stephanie FordyceLentz Gender: female Gestational Age: 2763w1d (At Birth) Birth Weight: 9 lb 8.9 oz (4335 g) Weight at Discharge: Weight: 8 lb 13.3 oz (4005 g)[facility]   09/27/15 2300 09/28/15 2300 09/29/15 2303  Weight: 9 lb 6.8 oz (4275 g) 9 lb 1.3 oz (4119 g) 8 lb 13.3 oz (4005 g)   [facility] 9#14[facility] Location:Pediatrician's office Weight today: 12# 8.9oz     ________________________________________________________________________  Mother's Name: Stephanie [name] Type of delivery:  cesarean Breastfeeding Experience:  1st child 14 mos, 2nd 916 mos, 3rd 2812 mos (had to stop BF 3rd related to persistent nipple damage Maternal Medical Conditions:  Gestational diabetes mellitis   ________________________________________________________________________  Breastfeeding History (Post Discharge)  Frequency of breastfeeding:  Every 2-3 hours Duration of feeding:  20-30 minutes  occasionally  Infant Intake and Output Assessment  Voids:  6-8 in 24 hrs.  Color:  Clear yellow Stools:  1-2 in 24 hrs.  Color:  Yellow  ________________________________________________________________________

## 2016-07-05 ENCOUNTER — Ambulatory Visit (INDEPENDENT_AMBULATORY_CARE_PROVIDER_SITE_OTHER): Payer: 59

## 2016-07-05 ENCOUNTER — Ambulatory Visit (INDEPENDENT_AMBULATORY_CARE_PROVIDER_SITE_OTHER): Payer: 59 | Admitting: Orthopaedic Surgery

## 2016-07-05 ENCOUNTER — Encounter (INDEPENDENT_AMBULATORY_CARE_PROVIDER_SITE_OTHER): Payer: Self-pay | Admitting: Orthopaedic Surgery

## 2016-07-05 DIAGNOSIS — M545 Low back pain: Secondary | ICD-10-CM

## 2016-07-05 DIAGNOSIS — G8929 Other chronic pain: Secondary | ICD-10-CM | POA: Diagnosis not present

## 2016-07-05 NOTE — Progress Notes (Signed)
Office Visit Note   Patient: Stephanie [name]           Date of Birth: 1975/09/08           MRN: [id number] Visit Date: 07/05/2016              Requested by: L.Lupe Carney, MD [address] [place], [place] [address] PCP: Lupe Carney, MD   Assessment & Plan: Visit Diagnoses:  1. Chronic left-sided low back pain, with sciatica presence unspecified     Plan: LLD and LBP from scoliosis, left SI joint dysfunction.  Recommend PT and shoe lift for LLD.  F/u prn  Follow-Up Instructions: Return if symptoms worsen or fail to improve.   Orders:  Orders Placed This Encounter  Procedures  . XR Lumbar Spine 2-3 Views   No orders of the defined types were placed in this encounter.     Procedures: No procedures performed   Clinical Data: No additional findings.   Subjective: Chief Complaint  Patient presents with  . Lower Back - Pain    41 yo female with chronic LBP with recent 4-5 month h/o worsening pain with mainly radiation into left SI joint.  Occasional radiation down into LLE but nothing constant.  Denies myelpathic sxs or bowel/bladder dysfunction.  Denies consitutional sxs.      Review of Systems  Constitutional: Negative.   HENT: Negative.   Eyes: Negative.   Respiratory: Negative.   Cardiovascular: Negative.   Endocrine: Negative.   Musculoskeletal: Negative.   Neurological: Negative.   Hematological: Negative.   Psychiatric/Behavioral: Negative.   All other systems reviewed and are negative.    Objective: Vital Signs: There were no vitals taken for this visit.  Physical Exam  Constitutional: She is oriented to person, place, and time. She appears well-developed and well-nourished.  HENT:  Head: Normocephalic and atraumatic.  Eyes: EOM are normal.  Neck: Neck supple.  Pulmonary/Chest: Effort normal.  Abdominal: Soft.  Neurological: She is alert and oriented to person, place, and time.  Skin: Skin is warm. Capillary refill takes  less than 2 seconds.  Psychiatric: She has a normal mood and affect. Her behavior is normal. Judgment and thought content normal.  Nursing note and vitals reviewed.   Left Hip Exam  Left hip exam is normal.   Back Exam   Tenderness  The patient is experiencing tenderness in the sacroiliac.  Range of Motion  The patient has normal back ROM. Rotation Right: normal  Rotation Left: normal   Muscle Strength  The patient has normal back strength.  Tests  Straight leg raise right: negative  Reflexes  Patellar: normal Achilles: normal  Other  Toe Walk: normal Heel Walk: normal Sensation: normal Gait: normal       Specialty Comments:  No specialty comments available.  Imaging: No results found.   PMFS History: Patient Active Problem List   Diagnosis Date Noted  . S/P cesarean section 09/27/2015  . Postpartum care following cesarean delivery 09/27/2015  . GDM, class A2 09/26/2015  . Missed abortion 11/29/2014   Past Medical History:  Diagnosis Date  . Environmental allergies   . Gestational diabetes   . Gestational diabetes mellitus (GDM), antepartum   . Kidney stones   . Vaginal delivery 1999, 2002,2006    No family history on file.  Past Surgical History:  Procedure Laterality Date  . BRAIN TUMOR EXCISION    . CESAREAN SECTION N/A 09/27/2015   Procedure: CESAREAN SECTION;  Surgeon:  Cecilia Delene LollWorema Banga, DO;  Location: [facility];  Service: Obstetrics;  Laterality: N/A;  . DILATION AND EVACUATION N/A 11/29/2014   Procedure: DILATATION AND EVACUATION;  Surgeon: Lavina Hammanodd Meisinger, MD;  Location: [facility];  Service: Gynecology;  Laterality: N/A;  . SPHENOIDECTOMY  11/2012  . TONSILLECTOMY     Social History   Occupational History  . Not on file.   Social History Main Topics  . Smoking status: Never Smoker  . Smokeless tobacco: Never Used  . Alcohol use Yes     Comment: social  . Drug use: No  . Sexual activity: Yes

## 2016-07-21 ENCOUNTER — Encounter (HOSPITAL_BASED_OUTPATIENT_CLINIC_OR_DEPARTMENT_OTHER): Admission: RE | Payer: Self-pay | Source: Ambulatory Visit

## 2016-07-21 ENCOUNTER — Ambulatory Visit (HOSPITAL_BASED_OUTPATIENT_CLINIC_OR_DEPARTMENT_OTHER): Admission: RE | Admit: 2016-07-21 | Payer: 59 | Source: Ambulatory Visit | Admitting: Obstetrics and Gynecology

## 2016-07-21 SURGERY — LIGATION, FALLOPIAN TUBE, LAPAROSCOPIC
Anesthesia: General | Laterality: Bilateral

## 2016-08-03 ENCOUNTER — Ambulatory Visit (INDEPENDENT_AMBULATORY_CARE_PROVIDER_SITE_OTHER): Payer: Self-pay | Admitting: Orthopaedic Surgery

## 2018-06-20 ENCOUNTER — Other Ambulatory Visit: Payer: Self-pay | Admitting: *Deleted

## 2018-06-20 DIAGNOSIS — M792 Neuralgia and neuritis, unspecified: Secondary | ICD-10-CM

## 2018-06-20 DIAGNOSIS — M79601 Pain in right arm: Secondary | ICD-10-CM

## 2018-06-29 ENCOUNTER — Encounter: Payer: 59 | Admitting: Neurology

## 2019-06-06 ENCOUNTER — Ambulatory Visit: Payer: Self-pay | Attending: Internal Medicine

## 2019-06-06 DIAGNOSIS — Z8616 Personal history of COVID-19: Secondary | ICD-10-CM

## 2019-06-06 DIAGNOSIS — Z20822 Contact with and (suspected) exposure to covid-19: Secondary | ICD-10-CM

## 2019-06-06 HISTORY — DX: Personal history of COVID-19: Z86.16

## 2019-06-08 LAB — NOVEL CORONAVIRUS, NAA: SARS-CoV-2, NAA: DETECTED — AB

## 2019-07-05 ENCOUNTER — Encounter (HOSPITAL_BASED_OUTPATIENT_CLINIC_OR_DEPARTMENT_OTHER): Payer: Self-pay

## 2019-07-05 ENCOUNTER — Ambulatory Visit (HOSPITAL_BASED_OUTPATIENT_CLINIC_OR_DEPARTMENT_OTHER): Admit: 2019-07-05 | Payer: No Typology Code available for payment source | Admitting: Obstetrics and Gynecology

## 2019-07-05 SURGERY — ANTERIOR (CYSTOCELE) AND POSTERIOR REPAIR (RECTOCELE)
Anesthesia: Choice

## 2019-08-23 ENCOUNTER — Ambulatory Visit: Payer: No Typology Code available for payment source | Attending: Internal Medicine

## 2019-08-23 DIAGNOSIS — Z23 Encounter for immunization: Secondary | ICD-10-CM

## 2019-08-23 NOTE — Progress Notes (Signed)
   Covid-19 Vaccination Clinic  Name:  JESSICAMARIE AMIRI    MRN: [id number] DOB: 03/18/1976  08/23/2019  Ms. Faulk was observed post Covid-19 immunization for 15 minutes without incident. She was provided with Vaccine Information Sheet and instruction to access the V-Safe system.   Ms. Atilano was instructed to call 911 with any severe reactions post vaccine: Marland Kitchen Difficulty breathing  . Swelling of face and throat  . A fast heartbeat  . A bad rash all over body  . Dizziness and weakness   Immunizations Administered    Name Date Dose VIS Date Route   Pfizer COVID-19 Vaccine 08/23/2019  8:44 AM 0.3 mL 05/04/2019 Intramuscular   Manufacturer: [employer], [employer]   Lot: [id number]   NDC: [address]-6

## 2019-09-17 ENCOUNTER — Ambulatory Visit: Payer: No Typology Code available for payment source | Attending: Internal Medicine

## 2019-09-17 DIAGNOSIS — Z23 Encounter for immunization: Secondary | ICD-10-CM

## 2019-09-17 NOTE — Progress Notes (Signed)
   Covid-19 Vaccination Clinic  Name:  Stephanie [name]    MRN: [id number] DOB: 01-18-1976  09/17/2019  [name] was observed post Covid-19 immunization for 15 minutes without incident. She was provided with Vaccine Information Sheet and instruction to access the V-Safe system.   [name] was instructed to call 911 with any severe reactions post vaccine: Marland Kitchen Difficulty breathing  . Swelling of face and throat  . A fast heartbeat  . A bad rash all over body  . Dizziness and weakness   Immunizations Administered    Name Date Dose VIS Date Route   Pfizer COVID-19 Vaccine 09/17/2019  3:06 PM 0.3 mL 07/18/2018 Intramuscular   Manufacturer: [employer], [employer]   Lot: [id number]   NDC: [address]-2

## 2019-10-24 ENCOUNTER — Other Ambulatory Visit: Payer: Self-pay | Admitting: Oral Surgery

## 2019-10-24 ENCOUNTER — Ambulatory Visit
Admission: RE | Admit: 2019-10-24 | Discharge: 2019-10-24 | Disposition: A | Discharge status: Home / Self Care | Payer: No Typology Code available for payment source | Source: Ambulatory Visit | Attending: Family Medicine | Admitting: Family Medicine

## 2019-10-24 ENCOUNTER — Encounter (HOSPITAL_COMMUNITY): Payer: Self-pay

## 2019-10-24 ENCOUNTER — Other Ambulatory Visit: Payer: Self-pay | Admitting: Family Medicine

## 2019-10-24 DIAGNOSIS — R109 Unspecified abdominal pain: Secondary | ICD-10-CM

## 2019-10-24 MED ORDER — IOPAMIDOL (ISOVUE-300) INJECTION 61%
100.0000 mL | Freq: Once | INTRAVENOUS | Status: AC | PRN
  Administered 2019-10-24: 100 mL via INTRAVENOUS

## 2019-10-24 NOTE — Patient Instructions (Addendum)
DUE TO COVID-19 ONLY ONE VISITOR IS ALLOWED IN WAITING ROOM (VISITOR WILL HAVE A TEMPERATURE CHECK ON ARRIVAL AND MUST WEAR A FACE MASK THE ENTIRE TIME.)  ONCE YOU ARE ADMITTED TO YOUR PRIVATE ROOM, THE SAME ONE VISITOR IS ALLOWED TO VISIT DURING VISITING HOURS ONLY.  Your COVID swab testing is scheduled for Monday, November 12, 2019  at 3:05PM , You must self quarantine after your testing per handout given to you at the testing site.  ([address] [place], [facility] Drive up testing enter pre-surgical testing line)    Your procedure is scheduled on: Thursday, November 15, 2019  Report to [facility]  AT 5:30  A. M.   Call this number if you have problems the morning of surgery:  [phone].   OUR ADDRESS IS [address].  WE ARE LOCATED IN [place]                                   [place].                                     REMEMBER:  DO NOT EAT FOOD AFTER MIDNIGHT .   MAY HAVE LIQUIDS UNTIL 4:30 AM DAY OF SURGERY  CLEAR LIQUID DIET  Foods Allowed                                                                     Foods Excluded  Water, Black Coffee and tea, regular and decaf                             liquids that you cannot  Plain Jell-O in any flavor  (No red)                                           see through such as: Fruit ices (not with fruit pulp)                                     milk, soups, orange juice  Iced Popsicles (No red)                                    All solid food Carbonated beverages, regular and diet                                    Apple juices Sports drinks like Gatorade (No red) Lightly seasoned clear broth or consume(fat free) Sugar, honey syrup  Sample Menu Breakfast  Lunch                                     Supper Cranberry juice                    Beef broth                            Chicken broth Jell-O                                     Grape juice                            Apple juice Coffee or tea                        Jell-O                                      Popsicle                                                Coffee or tea                        Coffee or tea   BRUSH YOUR TEETH THE MORNING OF SURGERY.  TAKE THESE MEDICATIONS MORNING OF SURGERY WITH A SIP OF WATER:  Duloxetine  DO NOT WEAR JEWERLY, MAKE UP, OR NAIL POLISH.  DO NOT WEAR LOTIONS, POWDERS, PERFUMES/COLOGNE OR DEODORANT.  DO NOT SHAVE FOR 24 HOURS PRIOR TO DAY OF SURGERY.  CONTACTS, GLASSES, OR DENTURES MAY NOT BE WORN TO SURGERY.                                    [facility] IS NOT RESPONSIBLE  FOR ANY BELONGINGS.          BRING ALL PRESCRIPTION MEDICATIONS WITH YOU THE DAY OF SURGERY IN ORIGINAL CONTAINERS                                                               [facility] - Preparing for Surgery Before surgery, you can play an important role.  Because skin is not sterile, your skin needs to be as free of germs as possible.  You can reduce the number of germs on your skin by washing with CHG (chlorahexidine gluconate) soap before surgery.  CHG is an antiseptic cleaner which kills germs and bonds with the skin to continue killing germs even after washing. Please DO NOT use if you have an allergy to CHG or antibacterial soaps.  If your skin becomes reddened/irritated stop using the CHG and inform your nurse when you arrive at Short Stay. Do not  shave (including legs and underarms) for at least 48 hours prior to the first CHG shower.  You may shave your face/neck.  Please follow these instructions carefully:  1.  Shower with CHG Soap the night before surgery and the  morning of surgery.  2.  If you choose to wash your hair, wash your hair first as usual with your normal  shampoo.  3.  After you shampoo, rinse your hair and body thoroughly to remove the shampoo.                             4.  Use CHG as you would any other liquid soap.  You can apply chg  directly to the skin and wash.  Gently with a scrungie or clean washcloth.  5.  Apply the CHG Soap to your body ONLY FROM THE NECK DOWN.   Do   not use on face/ open                           Wound or open sores. Avoid contact with eyes, ears mouth and   genitals (private parts).                       Wash face,  Genitals (private parts) with your normal soap.             6.  Wash thoroughly, paying special attention to the area where your    surgery  will be performed.  7.  Thoroughly rinse your body with warm water from the neck down.  8.  DO NOT shower/wash with your normal soap after using and rinsing off the CHG Soap.                9.  Pat yourself dry with a clean towel.            10.  Wear clean pajamas.            11.  Place clean sheets on your bed the night of your first shower and do not  sleep with pets. Day of Surgery : Do not apply any lotions/deodorants the morning of surgery.  Please wear clean clothes to the hospital/surgery center.  FAILURE TO FOLLOW THESE INSTRUCTIONS MAY RESULT IN THE CANCELLATION OF YOUR SURGERY  PATIENT SIGNATURE_________________________________  NURSE SIGNATURE__________________________________  ________________________________________________________________________   Stephanie MireIncentive Spirometer  An incentive spirometer is a tool that can help keep your lungs clear and active. This tool measures how well you are filling your lungs with each breath. Taking long deep breaths may help reverse or decrease the chance of developing breathing (pulmonary) problems (especially infection) following:  A long period of time when you are unable to move or be active. BEFORE THE PROCEDURE   If the spirometer includes an indicator to show your best effort, your nurse or respiratory therapist will set it to a desired goal.  If possible, sit up straight or lean slightly forward. Try not to slouch.  Hold the incentive spirometer in an upright position. INSTRUCTIONS  FOR USE  1. Sit on the edge of your bed if possible, or sit up as far as you can in bed or on a chair. 2. Hold the incentive spirometer in an upright position. 3. Breathe out normally. 4. Place the mouthpiece in your mouth and seal your lips tightly around it. 5. Breathe in slowly and as  deeply as possible, raising the piston or the ball toward the top of the column. 6. Hold your breath for 3-5 seconds or for as long as possible. Allow the piston or ball to fall to the bottom of the column. 7. Remove the mouthpiece from your mouth and breathe out normally. 8. Rest for a few seconds and repeat Steps 1 through 7 at least 10 times every 1-2 hours when you are awake. Take your time and take a few normal breaths between deep breaths. 9. The spirometer may include an indicator to show your best effort. Use the indicator as a goal to work toward during each repetition. 10. After each set of 10 deep breaths, practice coughing to be sure your lungs are clear. If you have an incision (the cut made at the time of surgery), support your incision when coughing by placing a pillow or rolled up towels firmly against it. Once you are able to get out of bed, walk around indoors and cough well. You may stop using the incentive spirometer when instructed by your caregiver.  RISKS AND COMPLICATIONS  Take your time so you do not get dizzy or light-headed.  If you are in pain, you may need to take or ask for pain medication before doing incentive spirometry. It is harder to take a deep breath if you are having pain. AFTER USE  Rest and breathe slowly and easily.  It can be helpful to keep track of a log of your progress. Your caregiver can provide you with a simple table to help with this. If you are using the spirometer at home, follow these instructions: SEEK MEDICAL CARE IF:   You are having difficultly using the spirometer.  You have trouble using the spirometer as often as instructed.  Your pain  medication is not giving enough relief while using the spirometer.  You develop fever of 100.5 F (38.1 C) or higher. SEEK IMMEDIATE MEDICAL CARE IF:   You cough up bloody sputum that had not been present before.  You develop fever of 102 F (38.9 C) or greater.  You develop worsening pain at or near the incision site. MAKE SURE YOU:   Understand these instructions.  Will watch your condition.  Will get help right away if you are not doing well or get worse. Document Released: 09/20/2006 Document Revised: 08/02/2011 Document Reviewed: 11/21/2006 ExitCare Patient Information 2014 ExitCare, [place].   ________________________________________________________________________  WHAT IS A BLOOD TRANSFUSION? Blood Transfusion Information  A transfusion is the replacement of blood or some of its parts. Blood is made up of multiple cells which provide different functions.  Red blood cells carry oxygen and are used for blood loss replacement.  White blood cells fight against infection.  Platelets control bleeding.  Plasma helps clot blood.  Other blood products are available for specialized needs, such as hemophilia or other clotting disorders. BEFORE THE TRANSFUSION  Who gives blood for transfusions?   Healthy volunteers who are fully evaluated to make sure their blood is safe. This is blood bank blood. Transfusion therapy is the safest it has ever been in the practice of medicine. Before blood is taken from a donor, a complete history is taken to make sure that person has no history of diseases nor engages in risky social behavior (examples are intravenous drug use or sexual activity with multiple partners). The donor's travel history is screened to minimize risk of transmitting infections, such as malaria. The donated blood is tested for signs of infectious diseases,  such as HIV and hepatitis. The blood is then tested to be sure it is compatible with you in order to minimize the  chance of a transfusion reaction. If you or a relative donates blood, this is often done in anticipation of surgery and is not appropriate for emergency situations. It takes many days to process the donated blood. RISKS AND COMPLICATIONS Although transfusion therapy is very safe and saves many lives, the main dangers of transfusion include:   Getting an infectious disease.  Developing a transfusion reaction. This is an allergic reaction to something in the blood you were given. Every precaution is taken to prevent this. The decision to have a blood transfusion has been considered carefully by your caregiver before blood is given. Blood is not given unless the benefits outweigh the risks. AFTER THE TRANSFUSION  Right after receiving a blood transfusion, you will usually feel much better and more energetic. This is especially true if your red blood cells have gotten low (anemic). The transfusion raises the level of the red blood cells which carry oxygen, and this usually causes an energy increase.  The nurse administering the transfusion will monitor you carefully for complications. HOME CARE INSTRUCTIONS  No special instructions are needed after a transfusion. You may find your energy is better. Speak with your caregiver about any limitations on activity for underlying diseases you may have. SEEK MEDICAL CARE IF:   Your condition is not improving after your transfusion.  You develop redness or irritation at the intravenous (IV) site. SEEK IMMEDIATE MEDICAL CARE IF:  Any of the following symptoms occur over the next 12 hours:  Shaking chills.  You have a temperature by mouth above 102 F (38.9 C), not controlled by medicine.  Chest, back, or muscle pain.  People around you feel you are not acting correctly or are confused.  Shortness of breath or difficulty breathing.  Dizziness and fainting.  You get a rash or develop hives.  You have a decrease in urine output.  Your urine  turns a dark color or changes to pink, red, or brown. Any of the following symptoms occur over the next 10 days:  You have a temperature by mouth above 102 F (38.9 C), not controlled by medicine.  Shortness of breath.  Weakness after normal activity.  The white part of the eye turns yellow (jaundice).  You have a decrease in the amount of urine or are urinating less often.  Your urine turns a dark color or changes to pink, red, or brown. Document Released: 05/07/2000 Document Revised: 08/02/2011 Document Reviewed: 12/25/2007 [facility] Patient Information 2014 [place], [place].  _______________________________________________________________________

## 2019-10-30 ENCOUNTER — Other Ambulatory Visit: Payer: Self-pay

## 2019-10-30 ENCOUNTER — Encounter (HOSPITAL_COMMUNITY)
Admission: RE | Admit: 2019-10-30 | Discharge: 2019-10-30 | Disposition: A | Discharge status: Home / Self Care | Payer: No Typology Code available for payment source | Source: Ambulatory Visit | Attending: Obstetrics and Gynecology | Admitting: Obstetrics and Gynecology

## 2019-10-30 ENCOUNTER — Encounter (HOSPITAL_COMMUNITY): Payer: Self-pay

## 2019-10-30 HISTORY — DX: Personal history of other diseases of the nervous system and sense organs: Z86.69

## 2019-10-30 HISTORY — DX: Panic disorder (episodic paroxysmal anxiety): F41.0

## 2019-10-30 HISTORY — DX: Unspecified osteoarthritis, unspecified site: M19.90

## 2019-10-30 HISTORY — DX: Personal history of urinary calculi: Z87.442

## 2019-10-30 HISTORY — DX: Other complications of anesthesia, initial encounter: T88.59XA

## 2019-10-30 HISTORY — DX: Iron deficiency anemia, unspecified: D50.9

## 2019-10-30 HISTORY — DX: Personal history of other benign neoplasm: Z86.018

## 2019-10-30 NOTE — Progress Notes (Signed)
COVID Vaccine Completed: Yes Date COVID Vaccine completed: 09/17/19 COVID vaccine manufacturer: Pfizer       PCP - Dr. Asencion Gowda Cardiologist - N/A  Chest x-ray - N/A EKG - N/A Stress Test - N/A ECHO - N/A Cardiac Cath - N/A  Sleep Study - N/A CPAP - N/A  Fasting Blood Sugar - N/A Checks Blood Sugar __N/A___ times a day  Blood Thinner Instructions: N/A Aspirin Instructions: N/A Last Dose:N/A  Anesthesia review: N/A  Patient denies shortness of breath, fever, cough and chest pain at PAT appointment   Patient verbalized understanding of instructions that were given to them at the PAT appointment. Patient was also instructed that they will need to review over the PAT instructions again at home before surgery.

## 2019-11-06 ENCOUNTER — Encounter (HOSPITAL_COMMUNITY)
Admission: RE | Admit: 2019-11-06 | Discharge: 2019-11-06 | Disposition: A | Discharge status: Home / Self Care | Payer: No Typology Code available for payment source | Source: Ambulatory Visit | Attending: Obstetrics and Gynecology | Admitting: Obstetrics and Gynecology

## 2019-11-06 ENCOUNTER — Other Ambulatory Visit: Payer: Self-pay

## 2019-11-06 DIAGNOSIS — Z01812 Encounter for preprocedural laboratory examination: Secondary | ICD-10-CM | POA: Diagnosis not present

## 2019-11-06 LAB — CBC
HCT: 39.4 % (ref 36.0–46.0)
Hemoglobin: 13 g/dL (ref 12.0–15.0)
MCH: 30 pg (ref 26.0–34.0)
MCHC: 33 g/dL (ref 30.0–36.0)
MCV: 90.8 fL (ref 80.0–100.0)
Platelets: 233 10*3/uL (ref 150–400)
RBC: 4.34 MIL/uL (ref 3.87–5.11)
RDW: 13.2 % (ref 11.5–15.5)
WBC: 7.2 10*3/uL (ref 4.0–10.5)
nRBC: 0 % (ref 0.0–0.2)

## 2019-11-06 LAB — ABO/RH: ABO/RH(D): O POS

## 2019-11-12 ENCOUNTER — Other Ambulatory Visit (HOSPITAL_COMMUNITY)
Admission: RE | Admit: 2019-11-12 | Discharge: 2019-11-12 | Disposition: A | Discharge status: Home / Self Care | Payer: No Typology Code available for payment source | Source: Ambulatory Visit | Attending: Obstetrics and Gynecology | Admitting: Obstetrics and Gynecology

## 2019-11-12 DIAGNOSIS — Z20822 Contact with and (suspected) exposure to covid-19: Secondary | ICD-10-CM | POA: Diagnosis not present

## 2019-11-12 DIAGNOSIS — Z01812 Encounter for preprocedural laboratory examination: Secondary | ICD-10-CM | POA: Insufficient documentation

## 2019-11-12 LAB — SARS CORONAVIRUS 2 (TAT 6-24 HRS): SARS Coronavirus 2: NEGATIVE

## 2019-11-14 NOTE — H&P (Signed)
Stephanie [name] is an 44 y.o. female. She has been followed for symptomatic rectocele, has tried pessary but now wants surgical repair.  She has regular menses monthly, heavy for one day, but can have some heavy postcoital bleeding.  Also has stress incontinence.  Pertinent Gynecological History: Last mammogram: normal Date: 12/2018 Last pap: normal Date: 12/2017 OB History: G5, P4014 SVD x 3, LTCS x 1, SAB x 1   Menstrual History: Patient's last menstrual period was 10/28/2019 (exact date).    Past Medical History:  Diagnosis Date  . Arthritis    Right shoulder  . Complication of anesthesia    forgetful after anesthesia with C section and D&E  . Environmental allergies   . Gestational diabetes   . History of 2019 novel coronavirus disease (COVID-19) 06/06/2019   still has no regained sense of smell  . History of kidney stones   . History of meningioma   . History of migraine    Due to meningioma  . Iron deficiency anemia    with preg  . Panic attack   . Vaginal delivery 1999, 2002,2006    Past Surgical History:  Procedure Laterality Date  . BRAIN TUMOR EXCISION    . CESAREAN SECTION N/A 09/27/2015   Procedure: CESAREAN SECTION;  Surgeon: Stephanie Areola, DO;  Location: [facility];  Service: Obstetrics;  Laterality: N/A;  . DILATION AND EVACUATION N/A 11/29/2014   Procedure: DILATATION AND EVACUATION;  Surgeon: Stephanie Hamman, MD;  Location: [facility];  Service: Gynecology;  Laterality: N/A;  . SPHENOIDECTOMY  11/2012  . TONSILLECTOMY      No family history on file.  Social History:  reports that she has never smoked. She has never used smokeless tobacco. She reports current alcohol use. She reports that she does not use drugs.  Allergies:  Allergies  Allergen Reactions  . Flonase [Fluticasone Propionate] Other (See Comments)    Made pt pass out  . Latex Itching and Rash    No medications prior to admission.    Review of Systems  Respiratory: Negative.    Cardiovascular: Negative.     Last menstrual period 10/28/2019, unknown if currently breastfeeding. Physical Exam  Cardiovascular: Normal rate, regular rhythm and normal heart sounds.  No murmur heard. Respiratory: Effort normal and breath sounds normal. No respiratory distress. She has no wheezes.  GI: Soft. Normal appearance. She exhibits no distension and no mass. There is no abdominal tenderness.  Genitourinary:    Vulva normal.     Genitourinary Comments: Uterus normal size, RV-MP No adnexal mass Gr II rectocele, hypermobile urethra with possible small cystocele   Musculoskeletal:     Cervical back: Normal range of motion and neck supple.  Neurological: She is alert.    No results found for this or any previous visit (from the past 24 hour(s)).  No results found.  Assessment/Plan: Symptomatic rectocele, menorrhagia, postcoital bleeding, stress incontinence.  Has tried pessary but wants surgery.  Surgical procedure, risks, chances of relieving symptoms, risks specifically related to mesh sling have all been discussed.  Will admit for [facility], possible bilateral salpingectomy, posterior repair, possible anterior repair, Solyx sling, cystoscopy  Stephanie [name] 11/14/2019, 3:37 PM

## 2019-11-14 NOTE — Anesthesia Preprocedure Evaluation (Addendum)
Anesthesia Evaluation  Patient identified by MRN, date of birth, ID band Patient awake    Reviewed: Allergy & Precautions, NPO status , Patient's Chart, lab work & pertinent test results  History of Anesthesia Complications (+) history of anesthetic complications (POCD)  Airway Mallampati: II  TM Distance: >3 FB Neck ROM: Full    Dental  (+) Teeth Intact, Dental Advisory Given Upper permanent bridge, back left :   Pulmonary neg pulmonary ROS,    Pulmonary exam normal breath sounds clear to auscultation       Cardiovascular negative cardio ROS Normal cardiovascular exam Rhythm:Regular Rate:Normal     Neuro/Psych PSYCHIATRIC DISORDERS Anxiety negative neurological ROS     GI/Hepatic negative GI ROS, Neg liver ROS,  herniation of rectum into vagina   Endo/Other  negative endocrine ROS  Renal/GU negative Renal ROS     Musculoskeletal  (+) Arthritis ,   Abdominal   Peds  Hematology negative hematology ROS (+)   Anesthesia Other Findings   Reproductive/Obstetrics                            Anesthesia Physical Anesthesia Plan  ASA: II  Anesthesia Plan: General   Post-op Pain Management:    Induction: Intravenous  PONV Risk Score and Plan: 4 or greater and Midazolam, Propofol infusion, Dexamethasone and Ondansetron  Airway Management Planned: Oral ETT  Additional Equipment:   Intra-op Plan:   Post-operative Plan: Extubation in OR  Informed Consent: I have reviewed the patients History and Physical, chart, labs and discussed the procedure including the risks, benefits and alternatives for the proposed anesthesia with the patient or authorized representative who has indicated his/her understanding and acceptance.     Dental advisory given  Plan Discussed with: CRNA  Anesthesia Plan Comments:        Anesthesia Quick Evaluation

## 2019-11-15 ENCOUNTER — Ambulatory Visit (HOSPITAL_BASED_OUTPATIENT_CLINIC_OR_DEPARTMENT_OTHER): Payer: No Typology Code available for payment source | Admitting: Anesthesiology

## 2019-11-15 ENCOUNTER — Other Ambulatory Visit: Payer: Self-pay

## 2019-11-15 ENCOUNTER — Ambulatory Visit (HOSPITAL_BASED_OUTPATIENT_CLINIC_OR_DEPARTMENT_OTHER)
Admission: RE | Admit: 2019-11-15 | Discharge: 2019-11-16 | Disposition: A | Discharge status: Home / Self Care | Payer: No Typology Code available for payment source | Attending: Obstetrics and Gynecology | Admitting: Obstetrics and Gynecology

## 2019-11-15 ENCOUNTER — Encounter (HOSPITAL_BASED_OUTPATIENT_CLINIC_OR_DEPARTMENT_OTHER): Payer: Self-pay | Admitting: Obstetrics and Gynecology

## 2019-11-15 ENCOUNTER — Encounter (HOSPITAL_BASED_OUTPATIENT_CLINIC_OR_DEPARTMENT_OTHER)
Admission: RE | Disposition: A | Discharge status: Home / Self Care | Payer: Self-pay | Source: Home / Self Care | Attending: Obstetrics and Gynecology

## 2019-11-15 DIAGNOSIS — N812 Incomplete uterovaginal prolapse: Secondary | ICD-10-CM | POA: Diagnosis not present

## 2019-11-15 DIAGNOSIS — F419 Anxiety disorder, unspecified: Secondary | ICD-10-CM | POA: Diagnosis not present

## 2019-11-15 DIAGNOSIS — N920 Excessive and frequent menstruation with regular cycle: Secondary | ICD-10-CM | POA: Diagnosis present

## 2019-11-15 DIAGNOSIS — Z8616 Personal history of COVID-19: Secondary | ICD-10-CM | POA: Insufficient documentation

## 2019-11-15 DIAGNOSIS — Z888 Allergy status to other drugs, medicaments and biological substances status: Secondary | ICD-10-CM | POA: Insufficient documentation

## 2019-11-15 DIAGNOSIS — N816 Rectocele: Secondary | ICD-10-CM | POA: Diagnosis not present

## 2019-11-15 DIAGNOSIS — Z91040 Latex allergy status: Secondary | ICD-10-CM | POA: Diagnosis not present

## 2019-11-15 DIAGNOSIS — N393 Stress incontinence (female) (male): Secondary | ICD-10-CM | POA: Diagnosis not present

## 2019-11-15 DIAGNOSIS — N930 Postcoital and contact bleeding: Secondary | ICD-10-CM | POA: Insufficient documentation

## 2019-11-15 DIAGNOSIS — M19011 Primary osteoarthritis, right shoulder: Secondary | ICD-10-CM | POA: Diagnosis not present

## 2019-11-15 DIAGNOSIS — Z90710 Acquired absence of both cervix and uterus: Secondary | ICD-10-CM | POA: Diagnosis present

## 2019-11-15 HISTORY — PX: VAGINAL HYSTERECTOMY: SHX2639

## 2019-11-15 HISTORY — PX: ANTERIOR AND POSTERIOR REPAIR: SHX5121

## 2019-11-15 HISTORY — PX: BLADDER SUSPENSION: SHX72

## 2019-11-15 LAB — POCT PREGNANCY, URINE: Preg Test, Ur: NEGATIVE

## 2019-11-15 LAB — TYPE AND SCREEN
ABO/RH(D): O POS
Antibody Screen: NEGATIVE

## 2019-11-15 LAB — GLUCOSE, CAPILLARY: Glucose-Capillary: 127 mg/dL — ABNORMAL HIGH (ref 70–99)

## 2019-11-15 SURGERY — ANTERIOR (CYSTOCELE) AND POSTERIOR REPAIR (RECTOCELE)
Anesthesia: General | Site: Vagina

## 2019-11-15 MED ORDER — GABAPENTIN 300 MG PO CAPS
ORAL_CAPSULE | ORAL | Status: AC
  Filled 2019-11-15: qty 1

## 2019-11-15 MED ORDER — FLUORESCEIN SODIUM 10 % IV SOLN
INTRAVENOUS | Status: DC | PRN
  Administered 2019-11-15: 300 mg via INTRAVENOUS

## 2019-11-15 MED ORDER — PROMETHAZINE HCL 25 MG/ML IJ SOLN
INTRAMUSCULAR | Status: AC
  Filled 2019-11-15: qty 1

## 2019-11-15 MED ORDER — ONDANSETRON HCL 4 MG/2ML IJ SOLN
INTRAMUSCULAR | Status: AC
  Filled 2019-11-15: qty 2

## 2019-11-15 MED ORDER — GABAPENTIN 100 MG PO CAPS
ORAL_CAPSULE | ORAL | Status: AC
  Filled 2019-11-15: qty 3

## 2019-11-15 MED ORDER — ACETAMINOPHEN 500 MG PO TABS
ORAL_TABLET | ORAL | Status: AC
  Filled 2019-11-15: qty 2

## 2019-11-15 MED ORDER — OXYCODONE HCL 5 MG PO TABS
ORAL_TABLET | ORAL | Status: AC
  Filled 2019-11-15: qty 1

## 2019-11-15 MED ORDER — FENTANYL CITRATE (PF) 100 MCG/2ML IJ SOLN
25.0000 ug | INTRAMUSCULAR | Status: DC | PRN
  Administered 2019-11-15 (×3): 25 ug via INTRAVENOUS

## 2019-11-15 MED ORDER — FENTANYL CITRATE (PF) 100 MCG/2ML IJ SOLN
INTRAMUSCULAR | Status: AC
  Filled 2019-11-15: qty 2

## 2019-11-15 MED ORDER — SCOPOLAMINE 1 MG/3DAYS TD PT72
1.0000 | MEDICATED_PATCH | TRANSDERMAL | Status: DC
  Administered 2019-11-15: 1.5 mg via TRANSDERMAL

## 2019-11-15 MED ORDER — HYDROMORPHONE HCL 1 MG/ML IJ SOLN
INTRAMUSCULAR | Status: AC
  Filled 2019-11-15: qty 1

## 2019-11-15 MED ORDER — DEXAMETHASONE SODIUM PHOSPHATE 10 MG/ML IJ SOLN
INTRAMUSCULAR | Status: DC | PRN
  Administered 2019-11-15: 10 mg via INTRAVENOUS

## 2019-11-15 MED ORDER — PROPOFOL 10 MG/ML IV BOLUS
INTRAVENOUS | Status: DC | PRN
  Administered 2019-11-15: 170 mg via INTRAVENOUS

## 2019-11-15 MED ORDER — KETOROLAC TROMETHAMINE 30 MG/ML IJ SOLN
30.0000 mg | Freq: Four times a day (QID) | INTRAMUSCULAR | Status: DC
  Administered 2019-11-15 – 2019-11-16 (×2): 30 mg via INTRAVENOUS

## 2019-11-15 MED ORDER — ROCURONIUM BROMIDE 10 MG/ML (PF) SYRINGE
PREFILLED_SYRINGE | INTRAVENOUS | Status: AC
  Filled 2019-11-15: qty 10

## 2019-11-15 MED ORDER — PHENYLEPHRINE 40 MCG/ML (10ML) SYRINGE FOR IV PUSH (FOR BLOOD PRESSURE SUPPORT)
PREFILLED_SYRINGE | INTRAVENOUS | Status: DC | PRN
  Administered 2019-11-15: 80 ug via INTRAVENOUS

## 2019-11-15 MED ORDER — SIMETHICONE 80 MG PO CHEW: 80.0000 mg | CHEWABLE_TABLET | Freq: Four times a day (QID) | ORAL | Status: DC | PRN

## 2019-11-15 MED ORDER — ROCURONIUM BROMIDE 10 MG/ML (PF) SYRINGE
PREFILLED_SYRINGE | INTRAVENOUS | Status: DC | PRN
  Administered 2019-11-15: 20 mg via INTRAVENOUS
  Administered 2019-11-15: 60 mg via INTRAVENOUS

## 2019-11-15 MED ORDER — ALUM & MAG HYDROXIDE-SIMETH 200-200-20 MG/5ML PO SUSP: 30.0000 mL | ORAL | Status: DC | PRN

## 2019-11-15 MED ORDER — IBUPROFEN 800 MG PO TABS: 800.0000 mg | ORAL_TABLET | Freq: Four times a day (QID) | ORAL | Status: DC

## 2019-11-15 MED ORDER — HYDROMORPHONE HCL 1 MG/ML IJ SOLN
1.0000 mg | INTRAMUSCULAR | Status: DC | PRN
  Administered 2019-11-15: 1 mg via INTRAVENOUS

## 2019-11-15 MED ORDER — FLUORESCEIN SODIUM 10 % IV SOLN
INTRAVENOUS | Status: AC
  Filled 2019-11-15: qty 5

## 2019-11-15 MED ORDER — ENOXAPARIN SODIUM 40 MG/0.4ML ~~LOC~~ SOLN
40.0000 mg | SUBCUTANEOUS | Status: DC
  Administered 2019-11-16: 40 mg via SUBCUTANEOUS

## 2019-11-15 MED ORDER — KETOROLAC TROMETHAMINE 30 MG/ML IJ SOLN
INTRAMUSCULAR | Status: AC
  Filled 2019-11-15: qty 1

## 2019-11-15 MED ORDER — CYCLOBENZAPRINE HCL 5 MG PO TABS
5.0000 mg | ORAL_TABLET | Freq: Three times a day (TID) | ORAL | Status: DC | PRN
  Filled 2019-11-15: qty 1

## 2019-11-15 MED ORDER — CEFAZOLIN SODIUM-DEXTROSE 2-4 GM/100ML-% IV SOLN
2.0000 g | INTRAVENOUS | Status: AC
  Administered 2019-11-15: 2 g via INTRAVENOUS

## 2019-11-15 MED ORDER — VASOPRESSIN 20 UNIT/ML IV SOLN
INTRAVENOUS | Status: DC | PRN
  Administered 2019-11-15: 18 mL via INTRAMUSCULAR

## 2019-11-15 MED ORDER — KETOROLAC TROMETHAMINE 30 MG/ML IJ SOLN
INTRAMUSCULAR | Status: DC | PRN
Start: 2019-11-15 — End: 2019-11-15
  Administered 2019-11-15: 30 mg via INTRAVENOUS

## 2019-11-15 MED ORDER — BUPIVACAINE HCL (PF) 0.5 % IJ SOLN: INTRAMUSCULAR | Status: DC | PRN

## 2019-11-15 MED ORDER — DEXAMETHASONE SODIUM PHOSPHATE 10 MG/ML IJ SOLN
INTRAMUSCULAR | Status: AC
  Filled 2019-11-15: qty 1

## 2019-11-15 MED ORDER — ONDANSETRON HCL 4 MG PO TABS: 4.0000 mg | ORAL_TABLET | Freq: Four times a day (QID) | ORAL | Status: DC | PRN

## 2019-11-15 MED ORDER — PHENYLEPHRINE 40 MCG/ML (10ML) SYRINGE FOR IV PUSH (FOR BLOOD PRESSURE SUPPORT)
PREFILLED_SYRINGE | INTRAVENOUS | Status: AC
  Filled 2019-11-15: qty 10

## 2019-11-15 MED ORDER — ACETAMINOPHEN 500 MG PO TABS
1000.0000 mg | ORAL_TABLET | Freq: Four times a day (QID) | ORAL | Status: DC
  Administered 2019-11-15 – 2019-11-16 (×3): 1000 mg via ORAL

## 2019-11-15 MED ORDER — OXYCODONE HCL 5 MG PO TABS
5.0000 mg | ORAL_TABLET | ORAL | Status: DC | PRN
  Administered 2019-11-15 – 2019-11-16 (×4): 5 mg via ORAL

## 2019-11-15 MED ORDER — LACTATED RINGERS IV SOLN: INTRAVENOUS | Status: DC

## 2019-11-15 MED ORDER — DEXTROSE-NACL 5-0.45 % IV SOLN: INTRAVENOUS | Status: DC

## 2019-11-15 MED ORDER — BISACODYL 10 MG RE SUPP: 10.0000 mg | Freq: Every day | RECTAL | Status: DC | PRN

## 2019-11-15 MED ORDER — STERILE WATER FOR IRRIGATION IR SOLN
Status: DC | PRN
  Administered 2019-11-15: 1000 mL via INTRAVESICAL

## 2019-11-15 MED ORDER — MIDAZOLAM HCL 2 MG/2ML IJ SOLN
INTRAMUSCULAR | Status: DC | PRN
  Administered 2019-11-15: 2 mg via INTRAVENOUS

## 2019-11-15 MED ORDER — DULOXETINE HCL 30 MG PO CPEP
30.0000 mg | ORAL_CAPSULE | Freq: Every morning | ORAL | Status: DC
  Administered 2019-11-15: 30 mg via ORAL
  Filled 2019-11-15: qty 1

## 2019-11-15 MED ORDER — FENTANYL CITRATE (PF) 250 MCG/5ML IJ SOLN
INTRAMUSCULAR | Status: AC
  Filled 2019-11-15: qty 5

## 2019-11-15 MED ORDER — ACETAMINOPHEN 500 MG PO TABS
1000.0000 mg | ORAL_TABLET | Freq: Once | ORAL | Status: AC
  Administered 2019-11-15: 1000 mg via ORAL

## 2019-11-15 MED ORDER — LORAZEPAM 0.5 MG PO TABS
0.5000 mg | ORAL_TABLET | Freq: Three times a day (TID) | ORAL | Status: DC | PRN
  Filled 2019-11-15: qty 1

## 2019-11-15 MED ORDER — PROMETHAZINE HCL 25 MG/ML IJ SOLN
25.0000 mg | Freq: Four times a day (QID) | INTRAMUSCULAR | Status: DC | PRN
  Administered 2019-11-15: 25 mg via INTRAVENOUS

## 2019-11-15 MED ORDER — KETOROLAC TROMETHAMINE 30 MG/ML IJ SOLN
30.0000 mg | Freq: Once | INTRAMUSCULAR | Status: AC
  Administered 2019-11-15: 30 mg via INTRAVENOUS

## 2019-11-15 MED ORDER — BUPIVACAINE-EPINEPHRINE 0.5% -1:200000 IJ SOLN
INTRAMUSCULAR | Status: DC | PRN
  Administered 2019-11-15: 10 mL

## 2019-11-15 MED ORDER — SCOPOLAMINE 1 MG/3DAYS TD PT72
MEDICATED_PATCH | TRANSDERMAL | Status: AC
  Filled 2019-11-15: qty 1

## 2019-11-15 MED ORDER — MIDAZOLAM HCL 2 MG/2ML IJ SOLN
INTRAMUSCULAR | Status: AC
  Filled 2019-11-15: qty 2

## 2019-11-15 MED ORDER — ACETAMINOPHEN 500 MG PO TABS: 1000.0000 mg | ORAL_TABLET | ORAL | Status: DC

## 2019-11-15 MED ORDER — LIDOCAINE 2% (20 MG/ML) 5 ML SYRINGE
INTRAMUSCULAR | Status: AC
  Filled 2019-11-15: qty 5

## 2019-11-15 MED ORDER — MENTHOL 3 MG MT LOZG: 1.0000 | LOZENGE | OROMUCOSAL | Status: DC | PRN

## 2019-11-15 MED ORDER — GABAPENTIN 300 MG PO CAPS
300.0000 mg | ORAL_CAPSULE | ORAL | Status: AC
  Administered 2019-11-15: 300 mg via ORAL

## 2019-11-15 MED ORDER — GABAPENTIN 300 MG PO CAPS
300.0000 mg | ORAL_CAPSULE | Freq: Three times a day (TID) | ORAL | Status: DC
  Administered 2019-11-15 – 2019-11-16 (×3): 300 mg via ORAL

## 2019-11-15 MED ORDER — FENTANYL CITRATE (PF) 100 MCG/2ML IJ SOLN
INTRAMUSCULAR | Status: DC | PRN
  Administered 2019-11-15 (×3): 50 ug via INTRAVENOUS
  Administered 2019-11-15: 100 ug via INTRAVENOUS

## 2019-11-15 MED ORDER — ONDANSETRON HCL 4 MG/2ML IJ SOLN
INTRAMUSCULAR | Status: DC | PRN
  Administered 2019-11-15: 4 mg via INTRAVENOUS

## 2019-11-15 MED ORDER — PROMETHAZINE HCL 25 MG/ML IJ SOLN: 6.2500 mg | INTRAMUSCULAR | Status: DC | PRN

## 2019-11-15 MED ORDER — CEFAZOLIN SODIUM-DEXTROSE 2-4 GM/100ML-% IV SOLN
INTRAVENOUS | Status: AC
  Filled 2019-11-15: qty 100

## 2019-11-15 MED ORDER — SUGAMMADEX SODIUM 200 MG/2ML IV SOLN
INTRAVENOUS | Status: DC | PRN
  Administered 2019-11-15: 200 mg via INTRAVENOUS

## 2019-11-15 MED ORDER — PROPOFOL 500 MG/50ML IV EMUL
INTRAVENOUS | Status: AC
  Filled 2019-11-15: qty 50

## 2019-11-15 MED ORDER — LIDOCAINE 2% (20 MG/ML) 5 ML SYRINGE
INTRAMUSCULAR | Status: DC | PRN
  Administered 2019-11-15: 80 mg via INTRAVENOUS

## 2019-11-15 MED ORDER — ONDANSETRON HCL 4 MG/2ML IJ SOLN
4.0000 mg | Freq: Four times a day (QID) | INTRAMUSCULAR | Status: DC | PRN
  Administered 2019-11-15: 4 mg via INTRAVENOUS

## 2019-11-15 SURGICAL SUPPLY — 50 items
BLADE SURG 15 STRL LF DISP TIS (BLADE) ×3 IMPLANT
BLADE SURG 15 STRL SS (BLADE) ×5
CANISTER SUCT 3000ML PPV (MISCELLANEOUS) ×5 IMPLANT
CATH ROBINSON RED A/P 16FR (CATHETERS) IMPLANT
CATH SILICONE 16FRX5CC (CATHETERS) ×10 IMPLANT
CLOTH BEACON ORANGE TIMEOUT ST (SAFETY) ×5 IMPLANT
COVER WAND RF STERILE (DRAPES) ×5 IMPLANT
DECANTER SPIKE VIAL GLASS SM (MISCELLANEOUS) IMPLANT
DERMABOND ADVANCED (GAUZE/BANDAGES/DRESSINGS) ×2
DERMABOND ADVANCED .7 DNX12 (GAUZE/BANDAGES/DRESSINGS) ×3 IMPLANT
GAUZE 4X4 16PLY RFD (DISPOSABLE) ×5 IMPLANT
GAUZE PACKING 2X5 YD STRL (GAUZE/BANDAGES/DRESSINGS) ×5 IMPLANT
GLOVE BIO SURGEON STRL SZ8 (GLOVE) ×5 IMPLANT
GLOVE BIOGEL PI IND STRL 6.5 (GLOVE) ×15 IMPLANT
GLOVE BIOGEL PI IND STRL 7.0 (GLOVE) ×9 IMPLANT
GLOVE BIOGEL PI IND STRL 8 (GLOVE) ×3 IMPLANT
GLOVE BIOGEL PI INDICATOR 6.5 (GLOVE) ×10
GLOVE BIOGEL PI INDICATOR 7.0 (GLOVE) ×6
GLOVE BIOGEL PI INDICATOR 8 (GLOVE) ×2
GLOVE ORTHO TXT STRL SZ7.5 (GLOVE) ×15 IMPLANT
GOWN STRL REUS W/TWL LRG LVL3 (GOWN DISPOSABLE) ×15 IMPLANT
GOWN STRL REUS W/TWL XL LVL3 (GOWN DISPOSABLE) ×5 IMPLANT
NEEDLE HYPO 22GX1.5 SAFETY (NEEDLE) ×5 IMPLANT
NEEDLE MAYO CATGUT SZ4 (NEEDLE) IMPLANT
NS IRRIG 1000ML POUR BTL (IV SOLUTION) ×5 IMPLANT
NS IRRIG 500ML POUR BTL (IV SOLUTION) ×5 IMPLANT
PACK TRENDGUARD 450 HYBRID PRO (MISCELLANEOUS) IMPLANT
PACK VAGINAL WOMENS (CUSTOM PROCEDURE TRAY) ×5 IMPLANT
PAD OB MATERNITY 4.3X12.25 (PERSONAL CARE ITEMS) ×5 IMPLANT
SET IRRIG Y TYPE TUR BLADDER L (SET/KITS/TRAYS/PACK) ×5 IMPLANT
SHEARS FOC LG CVD HARMONIC 17C (MISCELLANEOUS) ×5 IMPLANT
SLING SOLYX SYSTEM SIS EA (Sling) ×5 IMPLANT
SUT CHROMIC 1 TIES 18 (SUTURE) IMPLANT
SUT CHROMIC 1MO 4 18 CR8 (SUTURE) ×5 IMPLANT
SUT PROLENE 1 CTX 30  8455H (SUTURE)
SUT PROLENE 1 CTX 30 8455H (SUTURE) IMPLANT
SUT SILK 0 SH 30 (SUTURE) IMPLANT
SUT SILK 2 0 SH (SUTURE) ×5 IMPLANT
SUT VIC AB 2-0 CT1 (SUTURE) ×10 IMPLANT
SUT VIC AB 2-0 CT1 27 (SUTURE) ×5
SUT VIC AB 2-0 CT1 TAPERPNT 27 (SUTURE) ×3 IMPLANT
SUT VIC AB 2-0 CT2 27 (SUTURE) ×20 IMPLANT
SUT VIC AB 3-0 CT1 27 (SUTURE)
SUT VIC AB 3-0 CT1 TAPERPNT 27 (SUTURE) IMPLANT
SUT VIC AB 3-0 SH 27 (SUTURE) ×5
SUT VIC AB 3-0 SH 27X BRD (SUTURE) ×3 IMPLANT
SUT VICRYL 0 UR6 27IN ABS (SUTURE) IMPLANT
TOWEL OR 17X26 10 PK STRL BLUE (TOWEL DISPOSABLE) ×5 IMPLANT
TRAY FOLEY W/BAG SLVR 14FR (SET/KITS/TRAYS/PACK) ×5 IMPLANT
TRENDGUARD 450 HYBRID PRO PACK (MISCELLANEOUS)

## 2019-11-15 NOTE — Interval H&P Note (Signed)
History and Physical Interval Note:  11/15/2019 7:16 AM  Stephanie [name]  has presented today for surgery, with the diagnosis of herniation of rectum into vagina.  The various methods of treatment have been discussed with the patient and family. After consideration of risks, benefits and other options for treatment, the patient has consented to  Procedure(s): Possible ANTERIOR (CYSTOCELE) AND POSTERIOR REPAIR (RECTOCELE), (N/A) HYSTERECTOMY VAGINAL, possible bilateral salpingectomy (Bilateral) TRANSVAGINAL TAPE (TVT) PROCEDURE (N/A) as a surgical intervention.  The patient's history has been reviewed, patient examined, no change in status, stable for surgery.  I have reviewed the patient's chart and labs.  Questions were answered to the patient's satisfaction.     Leighton Roach Krithika Tome

## 2019-11-15 NOTE — Transfer of Care (Signed)
Immediate Anesthesia Transfer of Care Note  Patient: Stephanie [name]  Procedure(s) Performed: Possible ANTERIOR (CYSTOCELE) AND POSTERIOR REPAIR (RECTOCELE), (N/A Vagina ) HYSTERECTOMY VAGINAL, possible bilateral salpingectomy (Bilateral Vagina ) TRANSVAGINAL TAPE (TVT) PROCEDURE (N/A Bladder)  Patient Location: PACU  Anesthesia Type:General  Level of Consciousness: awake, alert  and oriented  Airway & Oxygen Therapy: Patient Spontanous Breathing and Patient connected to face mask oxygen  Post-op Assessment: Report given to RN and Post -op Vital signs reviewed and stable  Post vital signs: Reviewed and stable  Last Vitals:  Vitals Value Taken Time  BP 129/88 11/15/19 0937  Temp    Pulse 115 11/15/19 0938  Resp 18 11/15/19 0938  SpO2 100 % 11/15/19 0938  Vitals shown include unvalidated device data.  Last Pain:  Vitals:   11/15/19 0607  TempSrc: Oral  PainSc: 0-No pain      Patients Stated Pain Goal: 2 (11/15/19 4997)  Complications: No complications documented.

## 2019-11-15 NOTE — Op Note (Signed)
Preoperative diagnosis: Symptomatic rectocele, menorrhagia, postcoital bleeding, stress urinary incontinence Postop diagnosis: Same, and cystocele Procedure: Total vaginal hysterectomy, bilateral salpingectomy, anterior and posterior colporrhaphy, Solyx sling, cystoscopy Surgeon: Cheri Fowler M.D. Assistant: Eula Flax, MD Anesthesia: Gen. Findings: Patient had a normal uterus with normal tubes and ovaries. Gr II rectocele, cystocele and perineal defect. Via cystoscopy bladder was normal and both ureters were patent.  Assistance from Dr. Wilhelmenia Blase was necessary throughout the case to help expose pedicles and control bleeding Estimated blood loss: 200 cc Specimens: Uterus and tubes for routine pathology Complications: None   Procedure in detail: The patient was taken to the operating room and placed in the dorsosupine position. General anesthesia was induced and she was placed in mobile stirrups. Legs were elevated in the stirrups. Perineum and vagina were then prepped and draped in the usual sterile fashion, bladder drained with red Robinson catheter. A Graves speculum was inserted in the vagina and the cervix was grasped with Ardis Hughs tenaculum. Dilute Pitressin with Marcaine was then instilled at the cervicovaginal junction which was then incised circumferentially with electrocautery. Sharp dissection was then used to further free the vagina from the cervix. Anterior peritoneum was identified and entered sharply. A Deaver retractor was used to retract the bladder anteriorly. Posterior cul-de-sac was identified and entered sharply. A Bonnano speculum was placed into the posterior cul-de-sac. Uterosacral ligaments were clamped transected and ligated with #1 chromic and tagged for later use. The remaining pedicles were then taken down with harmonic scalpel.  When just the tubes were still attached, the uterus was delivered posteriorly and both tubes were identified and traced to their fimbria.  Harmonic  scalpel was the used to free the tubes and any remaining attachments of the the uterus, the uterus and tubes were removed. Ovaries were inspected and found to be normal. A small amount of bleeding was controlled with the Harmonic scalpel.  The anterior vaginal cuff was grasped with 2 Allis clamps. The cystocele was then mobilized by dissecting anteriorly in the midline from the vaginal cuff to within about 2 cm of the urethral meatus in the midline. Cystocele was mobilized laterally bluntly and sharply. Cystocele was then reduced with interrupted sutures of 2-0 Vicryl with adequate reduction.  Excess vaginal tissue was removed and the incision was closed with running locking 2-0 Vicryl with adequate closure and adequate hemostasis.  The uterosacral ligaments were then plicated in the midline with 2-0 silk after the Bonnano speculum was removed and a shorter vaginal speculum was placed. The previously tagged uterosacral pedicles were also tied in the midline. No significant bleeding was identified. The vaginal cuff was then closed in a vertical fashion with running locking 2-0 Vicryl with adequate closure and adequate hemostasis.  Attention was now turned to the rectocele repair. The hymenal ring was grasped with 2 Allis clamps at a distance that when brought together would still allow two fingers to  easily pass into the vagina. A horizontal piece of tissue was removed. The posterior vaginal mucosa was then dissected in the midline from the hymenal ring to the vaginal apex. Rectocele was then mobilized bilaterally sharply and bluntly. The rectocele was then reduced with interrupted sutures of 2-0 Vicryl after a rectal exam confirmed this was all rectocele. This achieved good reduction. A repeat rectal exam confirmed good reduction of the rectocele. Excess vaginal mucosa was then removed sharply. Vagina and perineum was closed  with running locking 2-0 Vicryl.  The anterior vagina was grasped with Allis  clamps proximally  1 1/2 fingerbreadths from the urethral meatus. Local anesthetic with half percent Marcaine with epi was infiltrated in the midline and bilaterally for hydrodissection. A 1 cm vertical incision was was then made in the vagina between the Allis clamps. The edges of the incision were then grasped with the Allis clamps. Metzenbaum scissors were used to sharply dissect the vaginal mucosa to each pubic ramus. The Solyx sling was then first placed on the patient's right side and anchored behind the pubic bone.  A good placement was achieved on the right side. Placement was achieved on the left side in a similar fashion submucosal to just past the pubic ramus. A right angle clamp was able to just barely be passed between the sling and the urethral tissue confirming good tension. Cystoscopy was performed which revealed a normal bladder and no evidence of injury to the bladder or the urethra. 200 cc of fluid was used for the cystoscopy, good flow was seen from each ureteral orifice. The cystoscope was removed. A Cred maneuver was performed and no leakage was seen. The Solyx sling was released on the left side. The vaginal incision was then closed with running locking 2-0 Vicryl with adequate closure and adequate hemostasis. All instruments were then removed from the vagina. Vaginal packing was placed and a foley catheter inserted.  The patient tolerated the procedure well and was taken to the recovery room in stable condition. Counts were correct, she received Ancef prior to the procedure, she had PAS hose on throughout the procedure.

## 2019-11-15 NOTE — Anesthesia Postprocedure Evaluation (Signed)
Anesthesia Post Note  Patient: Stephanie [name]  Procedure(s) Performed: Possible ANTERIOR (CYSTOCELE) AND POSTERIOR REPAIR (RECTOCELE), (N/A Vagina ) HYSTERECTOMY VAGINAL, possible bilateral salpingectomy (Bilateral Vagina ) TRANSVAGINAL TAPE (TVT) PROCEDURE (N/A Bladder)     Patient location during evaluation: PACU Anesthesia Type: General Level of consciousness: awake and alert Pain management: pain level controlled Vital Signs Assessment: post-procedure vital signs reviewed and stable Respiratory status: spontaneous breathing, nonlabored ventilation, respiratory function stable and patient connected to nasal cannula oxygen Cardiovascular status: blood pressure returned to baseline and stable Postop Assessment: no apparent nausea or vomiting Anesthetic complications: no   No complications documented.  Last Vitals:  Vitals:   11/15/19 1100 11/15/19 1130  BP: 130/83 116/80  Pulse: 90 96  Resp: 16 16  Temp: (!) 36.3 C (!) 36.4 C  SpO2: 98% 95%    Last Pain:  Vitals:   11/15/19 1100  TempSrc:   PainSc: 6                  Cecile Hearing

## 2019-11-15 NOTE — Anesthesia Procedure Notes (Signed)
Procedure Name: Intubation Date/Time: 11/15/2019 7:29 AM Performed by: Pearson Grippe, CRNA Pre-anesthesia Checklist: Patient identified, Emergency Drugs available, Suction available and Patient being monitored Patient Re-evaluated:Patient Re-evaluated prior to induction Oxygen Delivery Method: Circle system utilized Preoxygenation: Pre-oxygenation with 100% oxygen Induction Type: IV induction Ventilation: Mask ventilation without difficulty Laryngoscope Size: Miller and 2 Grade View: Grade I Tube type: Oral Tube size: 7.0 mm Number of attempts: 1 Airway Equipment and Method: Stylet and Oral airway Placement Confirmation: ETT inserted through vocal cords under direct vision,  positive ETCO2 and breath sounds checked- equal and bilateral Secured at: 21 cm Tube secured with: Tape Dental Injury: Teeth and Oropharynx as per pre-operative assessment

## 2019-11-15 NOTE — Progress Notes (Signed)
Post-op check, s/p TVH, A&P repair, Solyx sling Has had some nausea and pain, sleeping currently after getting Toradol and phenergan Afeb, VSS Adequate clear urine Abd- soft Will continue routine care, will remove vaginal packing and foley in am

## 2019-11-16 ENCOUNTER — Encounter (HOSPITAL_BASED_OUTPATIENT_CLINIC_OR_DEPARTMENT_OTHER): Payer: Self-pay | Admitting: Obstetrics and Gynecology

## 2019-11-16 DIAGNOSIS — N816 Rectocele: Secondary | ICD-10-CM | POA: Diagnosis not present

## 2019-11-16 LAB — CBC
HCT: 32.6 % — ABNORMAL LOW (ref 36.0–46.0)
Hemoglobin: 10.8 g/dL — ABNORMAL LOW (ref 12.0–15.0)
MCH: 29.8 pg (ref 26.0–34.0)
MCHC: 33.1 g/dL (ref 30.0–36.0)
MCV: 89.8 fL (ref 80.0–100.0)
Platelets: 225 10*3/uL (ref 150–400)
RBC: 3.63 MIL/uL — ABNORMAL LOW (ref 3.87–5.11)
RDW: 13.4 % (ref 11.5–15.5)
WBC: 14.4 10*3/uL — ABNORMAL HIGH (ref 4.0–10.5)
nRBC: 0 % (ref 0.0–0.2)

## 2019-11-16 LAB — SURGICAL PATHOLOGY

## 2019-11-16 MED ORDER — GABAPENTIN 300 MG PO CAPS: 300.0000 mg | ORAL_CAPSULE | Freq: Three times a day (TID) | ORAL | 0 refills | Status: AC

## 2019-11-16 MED ORDER — ACETAMINOPHEN 500 MG PO TABS
ORAL_TABLET | ORAL | Status: AC
  Filled 2019-11-16: qty 2

## 2019-11-16 MED ORDER — KETOROLAC TROMETHAMINE 30 MG/ML IJ SOLN
INTRAMUSCULAR | Status: AC
  Filled 2019-11-16: qty 1

## 2019-11-16 MED ORDER — OXYCODONE HCL 5 MG PO TABS
ORAL_TABLET | ORAL | Status: AC
  Filled 2019-11-16: qty 2

## 2019-11-16 MED ORDER — OXYCODONE HCL 5 MG PO TABS: 5.0000 mg | ORAL_TABLET | ORAL | 0 refills | Status: AC | PRN

## 2019-11-16 MED ORDER — IBUPROFEN 800 MG PO TABS: 800.0000 mg | ORAL_TABLET | Freq: Three times a day (TID) | ORAL | 0 refills | Status: AC | PRN

## 2019-11-16 MED ORDER — ENOXAPARIN SODIUM 40 MG/0.4ML ~~LOC~~ SOLN
SUBCUTANEOUS | Status: AC
  Filled 2019-11-16: qty 0.4

## 2019-11-16 MED ORDER — GABAPENTIN 300 MG PO CAPS
ORAL_CAPSULE | ORAL | Status: AC
  Filled 2019-11-16: qty 1

## 2019-11-16 NOTE — Discharge Instructions (Signed)
Routine instructions for vaginal hysterectomy and sling

## 2019-11-16 NOTE — Progress Notes (Signed)
1 Day Post-Op Procedure(s) (LRB): Possible ANTERIOR (CYSTOCELE) AND POSTERIOR REPAIR (RECTOCELE), (N/A) HYSTERECTOMY VAGINAL, possible bilateral salpingectomy (Bilateral) TRANSVAGINAL TAPE (TVT) PROCEDURE (N/A)  Subjective: Patient reports incisional pain and tolerating PO.    Objective: I have reviewed patient's vital signs, intake and output and labs.  General: alert GI: soft, NT Vaginal Bleeding: minimal, packing removed  Assessment: s/p Procedure(s): Possible ANTERIOR (CYSTOCELE) AND POSTERIOR REPAIR (RECTOCELE), (N/A) HYSTERECTOMY VAGINAL, possible bilateral salpingectomy (Bilateral) TRANSVAGINAL TAPE (TVT) PROCEDURE (N/A): stable and progressing well  Plan: Advance diet Encourage ambulation Discontinue IV fluids Discharge home  LOS: 0 days    Zenaida Niece 11/16/2019, 7:18 AM

## 2019-11-19 NOTE — Discharge Summary (Signed)
Physician Discharge Summary  Patient ID: Stephanie [name] MRN: [id number] DOB/AGE: 1975-12-04 44 y.o.  Admit date: 11/15/2019 Discharge date: 11/16/2019  Admission Diagnoses:  Symptomatic pelvic relaxation, Stress incontinence  Discharge Diagnoses: Same Active Problems:   Rectocele   S/P vaginal hysterectomy   Discharged Condition: good  Hospital Course: Pt admitted for prolapse surgery.  Pre-op symptoms were mostly for rectocele and SUI, but on EUA she had Gr II cystocele and uterine descensus as well.  She underwent TVH, A&P repair and Solyx sling without difficulty.  No post-op complications.  Vaginal packing and foley removed POD #1, she was able to ambulate, void and tolerate PO, stable for discharge home   Discharge Exam: Blood pressure 106/63, pulse 93, temperature (!) 97.5 F (36.4 C), resp. rate 16, height 5\' 9"  (1.753 m), weight 83.2 kg, last menstrual period 10/28/2019, SpO2 96 %, unknown if currently breastfeeding. General appearance: alert  Disposition: Discharge disposition: 01-Home or Self Care       Discharge Instructions    Call MD for:  difficulty breathing, headache or visual disturbances   Complete by: As directed    Call MD for:  persistant dizziness or light-headedness   Complete by: As directed    Call MD for:  persistant nausea and vomiting   Complete by: As directed    Call MD for:  severe uncontrolled pain   Complete by: As directed    Call MD for:  temperature >100.4   Complete by: As directed    Diet - low sodium heart healthy   Complete by: As directed    Increase activity slowly   Complete by: As directed    Lifting restrictions   Complete by: As directed    10 lbs   Sexual Activity Restrictions   Complete by: As directed    Pelvic rest     Allergies as of 11/16/2019      Reactions   Flonase [fluticasone Propionate] Other (See Comments)   Made pt pass out   Latex Itching, Rash      Medication List    TAKE these medications    acetaminophen 500 MG tablet Commonly known as: TYLENOL Take 2 tablets (1,000 mg total) by mouth every 6 (six) hours as needed for moderate pain, fever or headache.   cyclobenzaprine 5 MG tablet Commonly known as: FLEXERIL Take 5 mg by mouth 3 (three) times daily as needed for muscle spasms.   DULoxetine 30 MG capsule Commonly known as: CYMBALTA Take 30 mg by mouth in the morning.   gabapentin 300 MG capsule Commonly known as: NEURONTIN Take 1 capsule (300 mg total) by mouth 3 (three) times daily.   ibuprofen 600 MG tablet Commonly known as: ADVIL Take 1 tablet (600 mg total) by mouth every 6 (six) hours. What changed: Another medication with the same name was added. Make sure you understand how and when to take each.   ibuprofen 800 MG tablet Commonly known as: ADVIL Take 1 tablet (800 mg total) by mouth every 8 (eight) hours as needed. What changed: You were already taking a medication with the same name, and this prescription was added. Make sure you understand how and when to take each.   LORazepam 0.5 MG tablet Commonly known as: ATIVAN Take 0.5 mg by mouth every 8 (eight) hours.   oxyCODONE 5 MG immediate release tablet Commonly known as: Oxy IR/ROXICODONE Take 1-2 tablets (5-10 mg total) by mouth every 4 (four) hours as needed for severe pain.  oxyCODONE-acetaminophen 5-325 MG tablet Commonly known as: Roxicet Take 1 tablet by mouth every 4 (four) hours as needed for severe pain.       Follow-up Information    Brithney Bensen, MD. Schedule an appointment as soon as possible for a visit in 2 week(s).   Specialty: Obstetrics and Gynecology Contact information: [address] [place] [address] [phone]               Signed: Leighton Roach Eliodoro Gullett 11/19/2019, 8:05 AM

## 2020-03-31 ENCOUNTER — Other Ambulatory Visit: Payer: Self-pay | Admitting: Obstetrics and Gynecology

## 2020-03-31 DIAGNOSIS — N644 Mastodynia: Secondary | ICD-10-CM

## 2020-03-31 DIAGNOSIS — N6452 Nipple discharge: Secondary | ICD-10-CM

## 2020-04-25 ENCOUNTER — Ambulatory Visit: Payer: No Typology Code available for payment source

## 2020-04-28 ENCOUNTER — Other Ambulatory Visit: Payer: No Typology Code available for payment source

## 2020-05-05 ENCOUNTER — Ambulatory Visit: Payer: No Typology Code available for payment source

## 2020-05-09 ENCOUNTER — Ambulatory Visit: Payer: No Typology Code available for payment source | Attending: Internal Medicine

## 2020-05-09 DIAGNOSIS — Z23 Encounter for immunization: Secondary | ICD-10-CM

## 2020-05-09 NOTE — Progress Notes (Signed)
   Covid-19 Vaccination Clinic  Name:  Stephanie [name]    MRN: [id number] DOB: 11/13/1975  05/09/2020  [name] was observed post Covid-19 immunization for 15 minutes without incident. She was provided with Vaccine Information Sheet and instruction to access the V-Safe system.   [name] was instructed to call 911 with any severe reactions post vaccine: Marland Kitchen Difficulty breathing  . Swelling of face and throat  . A fast heartbeat  . A bad rash all over body  . Dizziness and weakness   Immunizations Administered    Name Date Dose VIS Date Route   Pfizer COVID-19 Vaccine 05/09/2020  4:46 PM 0.3 mL 03/12/2020 Intramuscular   Manufacturer: [employer], [employer]   Lot: [id number]   NDC: [address]-5

## 2020-09-25 ENCOUNTER — Encounter: Payer: Self-pay | Admitting: Physical Therapy

## 2020-09-25 ENCOUNTER — Other Ambulatory Visit: Payer: Self-pay

## 2020-09-25 ENCOUNTER — Ambulatory Visit
Payer: No Typology Code available for payment source | Attending: Obstetrics and Gynecology | Admitting: Physical Therapy

## 2020-09-25 DIAGNOSIS — M6281 Muscle weakness (generalized): Secondary | ICD-10-CM | POA: Diagnosis not present

## 2020-09-25 DIAGNOSIS — R252 Cramp and spasm: Secondary | ICD-10-CM | POA: Diagnosis present

## 2020-09-26 NOTE — Therapy (Addendum)
[facility] [address] [place], [place], [address] Phone: [phone]   Fax:  [phone]  Physical Therapy Evaluation  Patient Details  Name: Stephanie [name] MRN: [id number] Date of Birth: [birth year] Referring Provider (PT): Cheri Fowler, MD   Encounter Date: 09/25/2020   PT End of Session - 09/26/20 1000     Visit Number 1    Date for PT Re-Evaluation 12/18/20    Authorization Type [employer]    PT Start Time 1234    PT Stop Time 8184    PT Time Calculation (min) 40 min    Activity Tolerance Patient tolerated treatment well    Behavior During Therapy [facility] for tasks assessed/performed             Past Medical History:  Diagnosis Date   Arthritis    Right shoulder   Complication of anesthesia    forgetful after anesthesia with C section and D&E   Environmental allergies    Gestational diabetes    History of 2019 novel coronavirus disease (COVID-19) 06/06/2019   still has no regained sense of smell   History of kidney stones    History of meningioma    History of migraine    Due to meningioma   Iron deficiency anemia    with preg   Panic attack    Vaginal delivery 1999, 2002,2006    Past Surgical History:  Procedure Laterality Date   ANTERIOR AND POSTERIOR REPAIR N/A 11/15/2019   Procedure: Possible ANTERIOR (CYSTOCELE) AND POSTERIOR REPAIR (RECTOCELE),;  Surgeon: Cheri Fowler, MD;  Location: [place];  Service: Gynecology;  Laterality: N/A;   BLADDER SUSPENSION N/A 11/15/2019   Procedure: TRANSVAGINAL TAPE (TVT) PROCEDURE;  Surgeon: Cheri Fowler, MD;  Location: [place];  Service: Gynecology;  Laterality: N/A;   BRAIN TUMOR EXCISION     CESAREAN SECTION N/A 09/27/2015   Procedure: CESAREAN SECTION;  Surgeon: Sherlyn Hay, DO;  Location: [place];  Service: Obstetrics;  Laterality: N/A;   DILATION AND EVACUATION N/A 11/29/2014   Procedure:  DILATATION AND EVACUATION;  Surgeon: Cheri Fowler, MD;  Location: [facility];  Service: Gynecology;  Laterality: N/A;   SPHENOIDECTOMY  11/2012   TONSILLECTOMY     VAGINAL HYSTERECTOMY Bilateral 11/15/2019   Procedure: HYSTERECTOMY VAGINAL, possible bilateral salpingectomy;  Surgeon: Cheri Fowler, MD;  Location: [place];  Service: Gynecology;  Laterality: Bilateral;    There were no vitals filed for this visit.    Subjective Assessment - 09/25/20 1238     Subjective Pt states she constantly has urge to go.  Pt goes at least 3 x and worse when it is a softer stool.  Pt states she is bearing down to have BM. Takes longer to pee    Currently in Pain? No/denies                [facility] PT Assessment - 09/26/20 0001       Assessment   Medical Diagnosis N81.6 (ICD-10-CM) - Rectocele    Referring Provider (PT) Meisinger, Sherren Mocha, MD    Prior Therapy No      Precautions   Precautions None      Home Environment   Living Environment Private residence    Living Arrangements Spouse/significant other;Children   4 children - 22-5 y/o tomorrow     Prior Function   Level of Independence Independent    Vocation Full time employment  Cognition   Overall Cognitive Status Within Functional Limits for tasks assessed      Posture/Postural Control   Posture/Postural Control Postural limitations    Postural Limitations Anterior pelvic tilt;Increased lumbar lordosis   ribcage with increased excursion but good mobility when prompted to exhale fully in supine; LLD Lt LE shorter     ROM / Strength   AROM / PROM / Strength PROM;Strength      PROM   Overall PROM Comments hip ER tight bilat      Strength   Overall Strength Comments 5/5 hip strength bil      Flexibility   Soft Tissue Assessment /Muscle Length yes    Hamstrings Rt 80%; Lt 90%      Ambulation/Gait   Gait Pattern Within Functional Limits                        Objective measurements  completed on examination: See above findings.     Pelvic Floor Special Questions - 09/26/20 0001     Prior Pregnancies Yes    Number of Pregnancies 4    Number of C-Sections 1    Number of Vaginal Deliveries 3    Any difficulty with labor and deliveries Yes    Currently Sexually Active Yes    Is this Painful No    Urinary Leakage No    Urinary frequency fecal frequency    Fecal incontinence No    Falling out feeling (prolapse) Yes    Activities that cause feeling of prolapse sitting and feels like rectum is full    Skin Integrity Intact    Perineal Body/Introitus  Descended    Prolapse Posterior Wall   observed slight   Pelvic Floor Internal Exam pt identity confirmed and internal soft tissue assessed with informed consent    Exam Type Vaginal    Palpation tight levators and restricted urethra; levators very weak tension and already high tone, very slow to activate and only after 10-20 seconds of stretching    Strength Flicker    Tone high                      PT Education - 09/26/20 1734     Education Details toileting techinques for reduced bearing down; internal fascial release with coconut oil and stretch levators    Person(s) Educated Patient    Methods Explanation;Handout    Comprehension Verbalized understanding              PT Short Term Goals - 09/26/20 1006       PT SHORT TERM GOAL #1   Title ind with toileting techniques    Time 4    Period Weeks    Status New    Target Date 10/23/20      PT SHORT TERM GOAL #2   Title able to feel she is activating muscles when doing a kegel    Time 4    Period Weeks    Status New    Target Date 10/23/20               PT Long Term Goals - 09/25/20 1525       PT LONG TERM GOAL #1   Title Pt will be ind with advanced HEP    Time 12    Period Weeks    Status New    Target Date 12/18/20      PT LONG TERM GOAL #2  Title Pt will be able to manage BM for improved emptying with minimal  straining    Time 12    Period Weeks    Status New    Target Date 12/18/20      PT LONG TERM GOAL #3   Title Pt will report at least 50% improvement of prolapse symptoms    Time 12    Period Weeks    Status New    Target Date 12/18/20      PT LONG TERM GOAL #4   Title Pt will report it takes at least 50% less time to get stream going when voiding    Time 12    Period Weeks    Status New    Target Date 12/18/20                    Plan - 09/26/20 1736     Clinical Impression Statement Pt presents to clinic s/p hysterectomy and vaginal wall repair due to POP that was worsened after the birth of her 4th child.  Upon assessment of the pelvic floor she has notably weakened muscles that are tense and have an elevated tone.  Pt was able to get 1/5 flicker contraction with some stretching to the muscle.  pt is not able to feel the muscles engage at this time.  pt also has posture compensations and LLD with Lt leg shorter than Rt.  She has tension in gluteals and lumbar paraspinals and bil hip ER is tight, bil hamstrings tight.  pt has impairments that can be addressed with skilled PT in order to work towards functional goals and improved bowel and bladder management for overall greater quality of life.    Personal Factors and Comorbidities Comorbidity 2    Comorbidities hysterectomy, ant/post repair - 11/15/19, 4th delivery trauma from very long labor with head stuck in pelvis    Examination-Activity Limitations Toileting;Sit    Examination-Participation Restrictions Community Activity    Stability/Clinical Decision Making Stable/Uncomplicated    Clinical Decision Making Moderate    Rehab Potential Excellent    PT Frequency 1x / week    PT Duration 12 weeks    PT Treatment/Interventions ADLs/Self Care Home Management;Biofeedback;Cryotherapy;Electrical Stimulation;Moist Heat;Neuromuscular re-education;Therapeutic exercise;Therapeutic activities;Taping;Manual techniques;Patient/family  education    PT Next Visit Plan review kegel and work on getting good contraction in isolation    PT Home Exercise Plan toilet and self massage techniques educated/reviewed at eval    Consulted and Agree with Plan of Care Patient             Patient will benefit from skilled therapeutic intervention in order to improve the following deficits and impairments:  Postural dysfunction,Decreased strength,Decreased coordination,Decreased range of motion  Visit Diagnosis: Muscle weakness (generalized)  Cramp and spasm     Problem List Patient Active Problem List   Diagnosis Date Noted   Rectocele 11/15/2019   S/P vaginal hysterectomy 11/15/2019   S/P cesarean section 09/27/2015   Postpartum care following cesarean delivery 09/27/2015   GDM, class A2 09/26/2015   Missed abortion 11/29/2014    Jule Ser, PT 09/26/2020, 5:42 PM  [facility] [address], [place], [place], [address] Phone: [phone]   Fax:  [phone]  Name: KORA GROOM MRN: [id number] Date of Birth: 06-12-1975  PHYSICAL THERAPY DISCHARGE SUMMARY  Visits from Start of Care: 1  Current functional level related to goals / functional outcomes:  See above goals, eval only  Remaining deficits: See above   Education / Equipment: HEP   Patient agrees to discharge. Patient goals were not met. Patient is being discharged due to the patient's request.  Gustavus Bryant, PT 12/02/20 2:43 PM

## 2020-10-09 ENCOUNTER — Ambulatory Visit: Payer: No Typology Code available for payment source | Admitting: Physical Therapy

## 2020-10-27 ENCOUNTER — Ambulatory Visit: Payer: No Typology Code available for payment source | Admitting: Physical Therapy

## 2020-11-27 ENCOUNTER — Ambulatory Visit: Payer: No Typology Code available for payment source | Admitting: Physical Therapy

## 2020-12-02 ENCOUNTER — Encounter: Payer: No Typology Code available for payment source | Admitting: Physical Therapy

## 2020-12-09 ENCOUNTER — Encounter: Payer: No Typology Code available for payment source | Admitting: Physical Therapy

## 2020-12-16 ENCOUNTER — Encounter: Payer: No Typology Code available for payment source | Admitting: Physical Therapy

## 2020-12-23 ENCOUNTER — Encounter: Payer: No Typology Code available for payment source | Admitting: Physical Therapy

## 2020-12-24 IMAGING — CT CT ABD-PELV W/ CM
2 of 5 series · 14 of 46 positions shown, 16 images · IV contrast (iopamidol)
Comparison: None.

CLINICAL DATA: Right abdominal pain, fever

EXAM:
CT ABDOMEN AND PELVIS WITH CONTRAST
TECHNIQUE: Multidetector CT imaging of the abdomen and pelvis was performed
using the standard protocol following bolus administration of
intravenous contrast.
CONTRAST:  100mL XVLBVD-7BB IOPAMIDOL (XVLBVD-7BB) INJECTION 61%

[Series 4: abd pelvis 5.00 br60 s3 axial · axial · 0.60mm/px · z∈[+1242,+1657]mm · 11 of 99 slices shown, 13 images]
[im 8/99  soft-tissue]
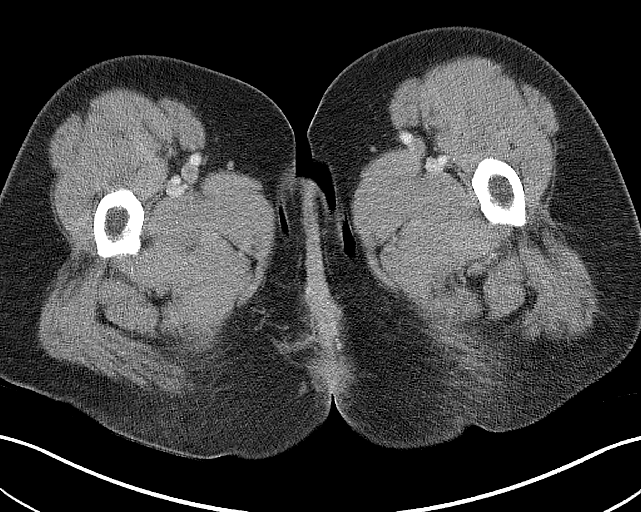
[im 8/99  bone]
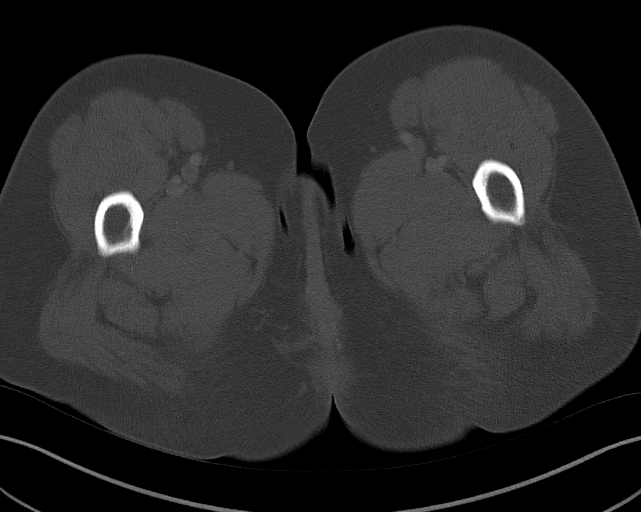
[im 16/99  soft-tissue]
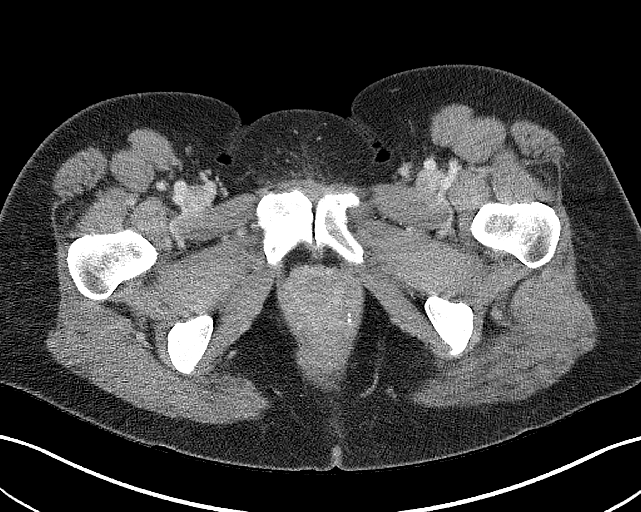
[im 23/99  soft-tissue]
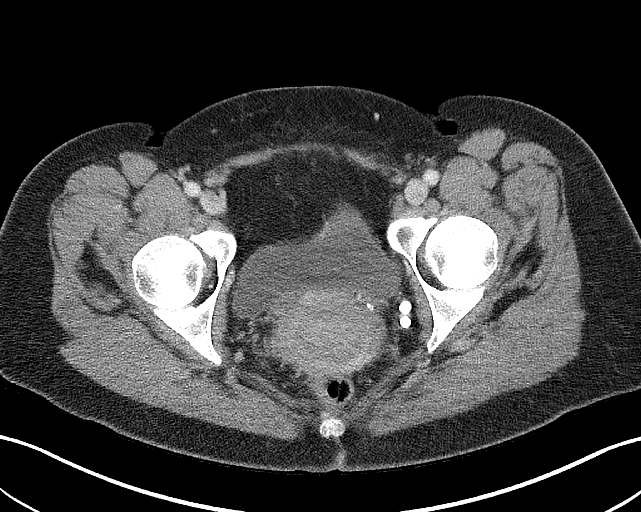
[im 31/99  soft-tissue]
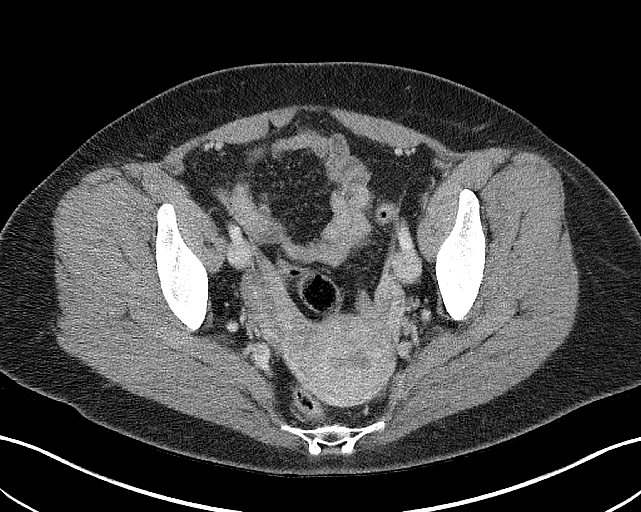
[im 38/99  soft-tissue]
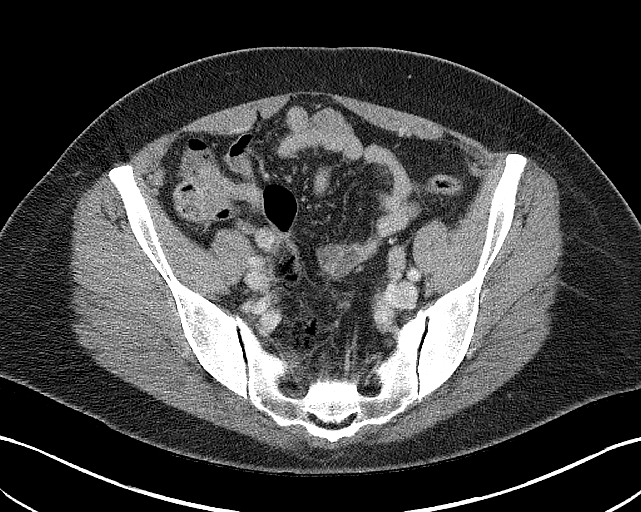
[im 53/99  soft-tissue]
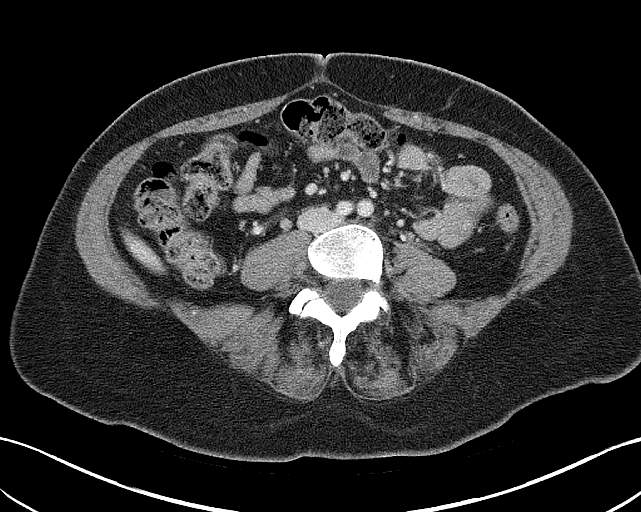
[im 61/99  soft-tissue]
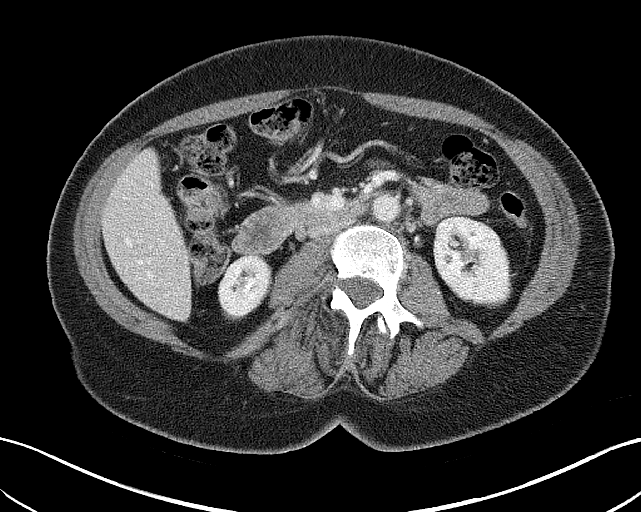
[im 68/99  soft-tissue]
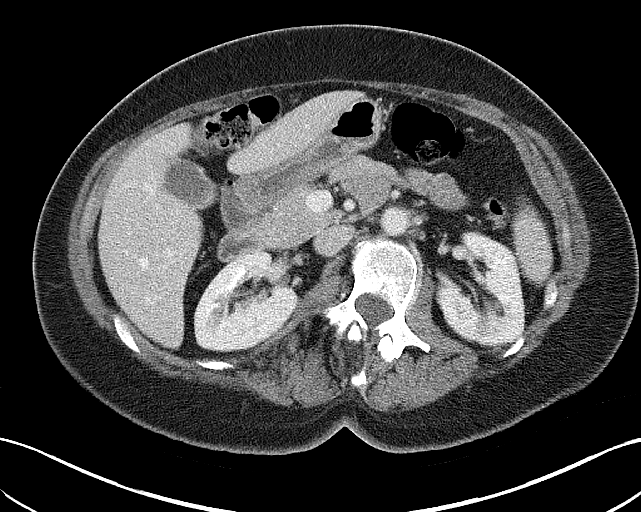
[im 76/99  soft-tissue]
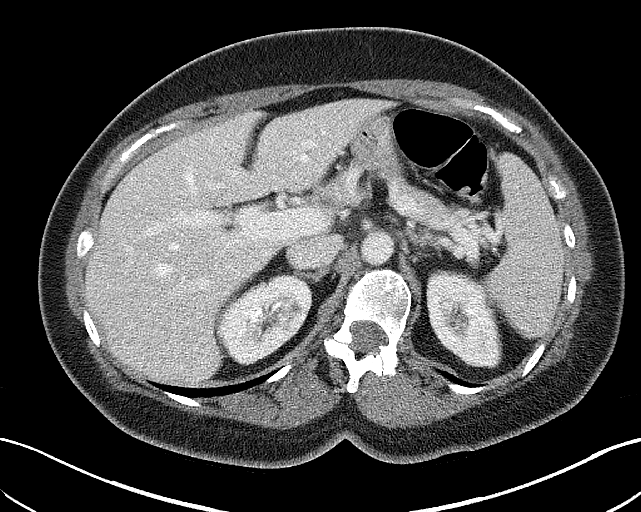
[im 76/99  bone]
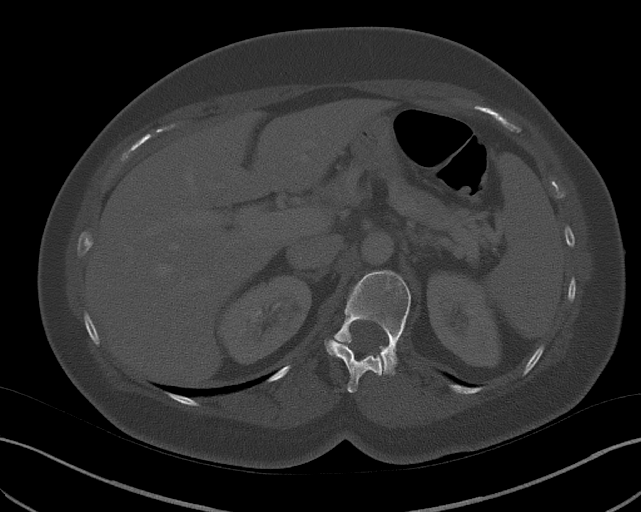
[im 83/99  soft-tissue]
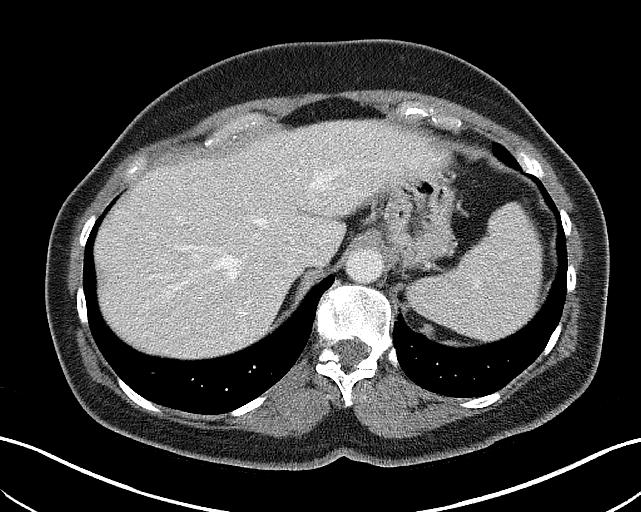
[im 91/99  soft-tissue]
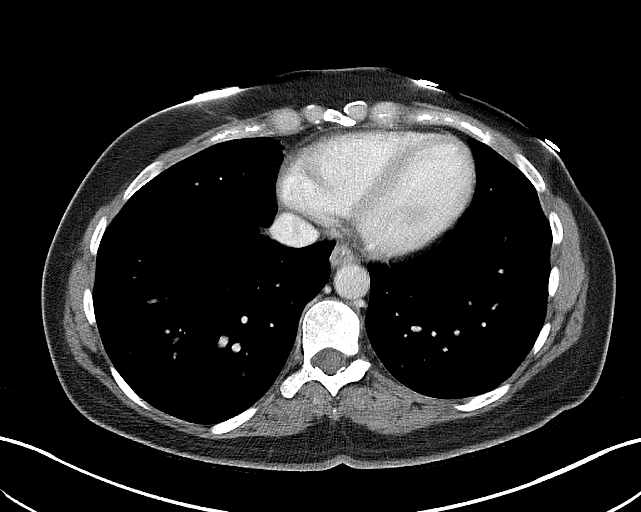

[Series 6: abd pelvis 2.00 br40 s3 cor · coronal · 0.76mm/px · 3 of 152 slices shown]
[im 51/152  soft-tissue]
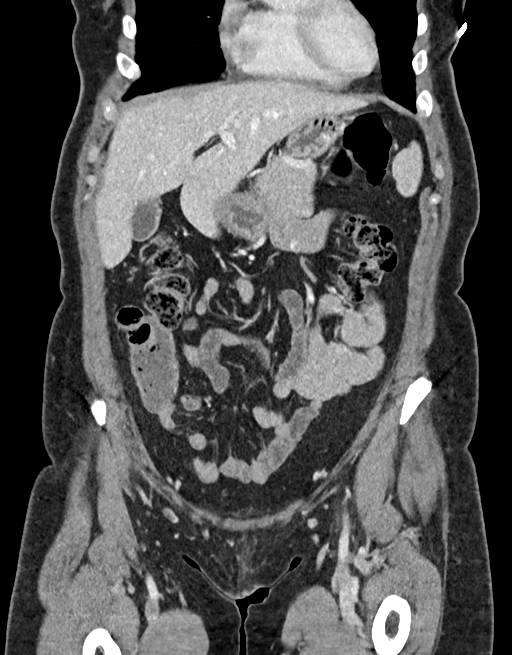
[im 68/152  soft-tissue]
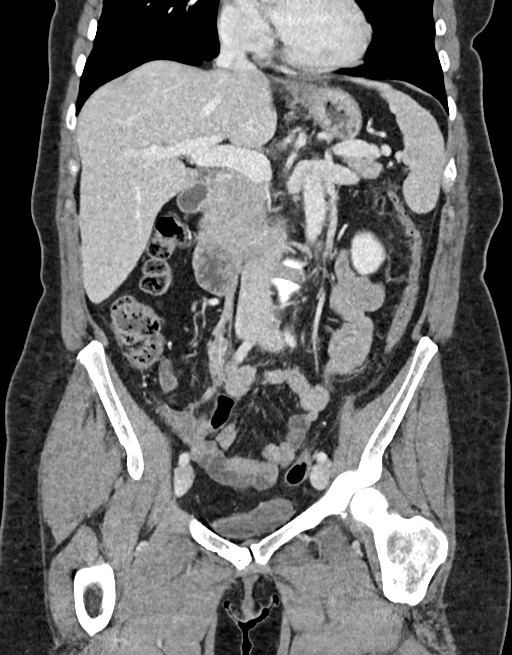
[im 84/152  soft-tissue]
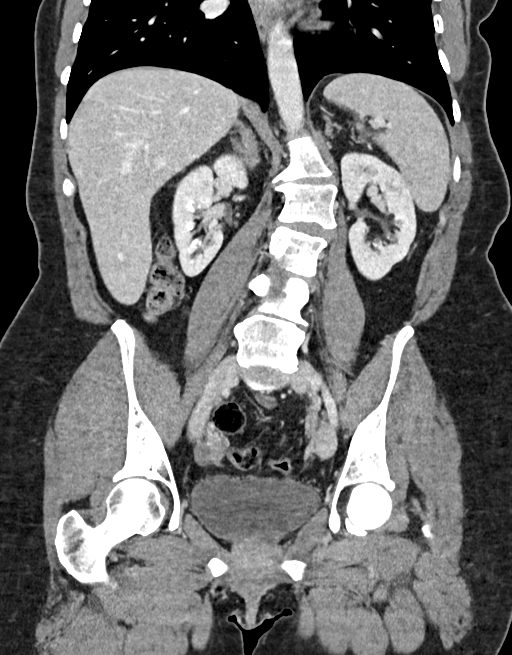

[14 of 46 positions shown; findings below may reference images not displayed]

FINDINGS: Lower chest: Lung bases are clear. No effusions. Heart is normal
size.

Hepatobiliary: 2 cm gallstone within the gallbladder. Small
hypodensity in the right hepatic lobe, likely small cyst. No
suspicious focal hepatic abnormality.

Pancreas: No focal abnormality or ductal dilatation.

Spleen: No focal abnormality.  Normal size.

Adrenals/Urinary Tract: No adrenal abnormality. No focal renal
abnormality. No stones or hydronephrosis. Urinary bladder is
unremarkable.

Stomach/Bowel: Normal appendix. Stomach, large and small bowel
grossly unremarkable.

Vascular/Lymphatic: No evidence of aneurysm or adenopathy.

Reproductive: Uterus and adnexa unremarkable.  No mass.

Other: No free fluid or free air.

Musculoskeletal: No acute bony abnormality.  Leftward scoliosis.
IMPRESSION: Normal appendix.

Cholelithiasis.

No acute findings.

## 2021-01-06 ENCOUNTER — Encounter: Payer: No Typology Code available for payment source | Admitting: Physical Therapy
